# Patient Record
Sex: Female | Born: 1985 | Race: Black or African American | Hispanic: No | Marital: Single | Smoking: Never smoker
Health system: Southern US, Community
[De-identification: ages and names within clinical notes are randomized; demographics above are authoritative.]

## PROBLEM LIST (undated history)

## (undated) ENCOUNTER — Ambulatory Visit (HOSPITAL_COMMUNITY): Admission: EM

## (undated) ENCOUNTER — Inpatient Hospital Stay (HOSPITAL_COMMUNITY): Payer: Self-pay

## (undated) DIAGNOSIS — O139 Gestational [pregnancy-induced] hypertension without significant proteinuria, unspecified trimester: Secondary | ICD-10-CM

## (undated) DIAGNOSIS — D219 Benign neoplasm of connective and other soft tissue, unspecified: Secondary | ICD-10-CM

## (undated) DIAGNOSIS — B999 Unspecified infectious disease: Secondary | ICD-10-CM

## (undated) DIAGNOSIS — L309 Dermatitis, unspecified: Secondary | ICD-10-CM

## (undated) DIAGNOSIS — J45909 Unspecified asthma, uncomplicated: Secondary | ICD-10-CM

## (undated) HISTORY — PX: THERAPEUTIC ABORTION: SHX798

---

## 2006-09-07 ENCOUNTER — Emergency Department (HOSPITAL_COMMUNITY): Admission: EM | Admit: 2006-09-07 | Discharge: 2006-09-07 | Payer: Self-pay | Admitting: Family Medicine

## 2006-09-24 ENCOUNTER — Emergency Department (HOSPITAL_COMMUNITY): Admission: EM | Admit: 2006-09-24 | Discharge: 2006-09-24 | Payer: Self-pay | Admitting: Family Medicine

## 2006-10-05 ENCOUNTER — Emergency Department (HOSPITAL_COMMUNITY): Admission: EM | Admit: 2006-10-05 | Discharge: 2006-10-05 | Payer: Self-pay | Admitting: Emergency Medicine

## 2006-10-22 ENCOUNTER — Emergency Department (HOSPITAL_COMMUNITY): Admission: EM | Admit: 2006-10-22 | Discharge: 2006-10-22 | Payer: Self-pay | Admitting: Emergency Medicine

## 2006-11-11 ENCOUNTER — Emergency Department (HOSPITAL_COMMUNITY): Admission: EM | Admit: 2006-11-11 | Discharge: 2006-11-11 | Payer: Self-pay | Admitting: Emergency Medicine

## 2006-12-14 ENCOUNTER — Emergency Department (HOSPITAL_COMMUNITY): Admission: EM | Admit: 2006-12-14 | Discharge: 2006-12-14 | Payer: Self-pay | Admitting: *Deleted

## 2006-12-16 ENCOUNTER — Inpatient Hospital Stay (HOSPITAL_COMMUNITY): Admission: AD | Admit: 2006-12-16 | Discharge: 2006-12-16 | Payer: Self-pay | Admitting: Family Medicine

## 2006-12-18 ENCOUNTER — Inpatient Hospital Stay (HOSPITAL_COMMUNITY): Admission: AD | Admit: 2006-12-18 | Discharge: 2006-12-18 | Payer: Self-pay | Admitting: Gynecology

## 2006-12-18 ENCOUNTER — Encounter (INDEPENDENT_AMBULATORY_CARE_PROVIDER_SITE_OTHER): Payer: Self-pay | Admitting: Gynecology

## 2007-03-01 ENCOUNTER — Inpatient Hospital Stay (HOSPITAL_COMMUNITY): Admission: AD | Admit: 2007-03-01 | Discharge: 2007-03-01 | Payer: Self-pay | Admitting: Gynecology

## 2007-03-04 ENCOUNTER — Inpatient Hospital Stay (HOSPITAL_COMMUNITY): Admission: AD | Admit: 2007-03-04 | Discharge: 2007-03-04 | Payer: Self-pay | Admitting: Gynecology

## 2007-06-24 ENCOUNTER — Emergency Department (HOSPITAL_COMMUNITY): Admission: EM | Admit: 2007-06-24 | Discharge: 2007-06-24 | Payer: Self-pay | Admitting: Family Medicine

## 2007-10-08 ENCOUNTER — Emergency Department (HOSPITAL_COMMUNITY): Admission: EM | Admit: 2007-10-08 | Discharge: 2007-10-09 | Payer: Self-pay | Admitting: Emergency Medicine

## 2007-12-13 ENCOUNTER — Inpatient Hospital Stay (HOSPITAL_COMMUNITY): Admission: AD | Admit: 2007-12-13 | Discharge: 2007-12-13 | Payer: Self-pay | Admitting: Obstetrics & Gynecology

## 2008-09-12 ENCOUNTER — Emergency Department (HOSPITAL_COMMUNITY): Admission: EM | Admit: 2008-09-12 | Discharge: 2008-09-12 | Payer: Self-pay | Admitting: Emergency Medicine

## 2010-11-12 LAB — URINALYSIS, ROUTINE W REFLEX MICROSCOPIC
Bilirubin Urine: NEGATIVE
Hgb urine dipstick: NEGATIVE
Ketones, ur: NEGATIVE mg/dL
Nitrite: NEGATIVE
Protein, ur: NEGATIVE mg/dL
Specific Gravity, Urine: 1.026 (ref 1.005–1.030)
Urobilinogen, UA: 1 mg/dL (ref 0.0–1.0)

## 2010-11-12 LAB — WET PREP, GENITAL: Trich, Wet Prep: NONE SEEN

## 2010-11-12 LAB — GC/CHLAMYDIA PROBE AMP, GENITAL: GC Probe Amp, Genital: POSITIVE — AB

## 2010-11-12 LAB — HSV 1 ANTIBODY, IGG: HSV 1 Glycoprotein G Ab, IgG: 30.6 IV — ABNORMAL HIGH

## 2010-11-12 LAB — POCT PREGNANCY, URINE: Preg Test, Ur: NEGATIVE

## 2010-11-12 LAB — RPR: RPR Ser Ql: NONREACTIVE

## 2010-11-12 LAB — HSV 2 ANTIBODY, IGG: HSV 2 Glycoprotein G Ab, IgG: 6.5 IV — ABNORMAL HIGH

## 2011-04-23 LAB — URINALYSIS, ROUTINE W REFLEX MICROSCOPIC
Glucose, UA: NEGATIVE
Hgb urine dipstick: NEGATIVE
Protein, ur: NEGATIVE
Specific Gravity, Urine: >1.03 — ABNORMAL HIGH
pH: 5.5

## 2011-04-23 LAB — POCT PREGNANCY, URINE
Operator id: 120561
Preg Test, Ur: POSITIVE

## 2011-05-12 LAB — URINALYSIS, ROUTINE W REFLEX MICROSCOPIC
Bilirubin Urine: NEGATIVE
Glucose, UA: NEGATIVE
Hgb urine dipstick: NEGATIVE
Hgb urine dipstick: NEGATIVE
Ketones, ur: 15 — AB
Ketones, ur: NEGATIVE
Nitrite: NEGATIVE
Nitrite: NEGATIVE
Protein, ur: NEGATIVE
Specific Gravity, Urine: >1.03 — ABNORMAL HIGH
Specific Gravity, Urine: >1.03 — ABNORMAL HIGH
Urobilinogen, UA: 0.2
Urobilinogen, UA: 0.2
pH: 5.5
pH: 6

## 2011-05-12 LAB — CBC
Hemoglobin: 12.9
MCHC: 33.7
MCV: 92.6
RBC: 4.15
RDW: 12.7

## 2011-05-12 LAB — WET PREP, GENITAL
Trich, Wet Prep: NONE SEEN
Yeast Wet Prep HPF POC: NONE SEEN

## 2011-05-12 LAB — POCT PREGNANCY, URINE: Operator id: 114931

## 2011-05-12 LAB — GC/CHLAMYDIA PROBE AMP, GENITAL
Chlamydia, DNA Probe: NEGATIVE
GC Probe Amp, Genital: NEGATIVE

## 2012-12-27 ENCOUNTER — Emergency Department (HOSPITAL_COMMUNITY)
Admission: EM | Admit: 2012-12-27 | Discharge: 2012-12-27 | Disposition: A | Discharge status: Home / Self Care | Payer: Medicaid Other | Source: Home / Self Care | Attending: Emergency Medicine | Admitting: Emergency Medicine

## 2012-12-27 ENCOUNTER — Encounter (HOSPITAL_COMMUNITY): Payer: Self-pay | Admitting: Emergency Medicine

## 2012-12-27 ENCOUNTER — Other Ambulatory Visit (HOSPITAL_COMMUNITY)
Admission: RE | Admit: 2012-12-27 | Discharge: 2012-12-27 | Disposition: A | Discharge status: Home / Self Care | Payer: Medicaid Other | Source: Ambulatory Visit | Attending: Emergency Medicine | Admitting: Emergency Medicine

## 2012-12-27 DIAGNOSIS — N898 Other specified noninflammatory disorders of vagina: Secondary | ICD-10-CM

## 2012-12-27 DIAGNOSIS — Z9189 Other specified personal risk factors, not elsewhere classified: Secondary | ICD-10-CM

## 2012-12-27 DIAGNOSIS — Z113 Encounter for screening for infections with a predominantly sexual mode of transmission: Secondary | ICD-10-CM | POA: Insufficient documentation

## 2012-12-27 DIAGNOSIS — N760 Acute vaginitis: Secondary | ICD-10-CM | POA: Insufficient documentation

## 2012-12-27 DIAGNOSIS — Z202 Contact with and (suspected) exposure to infections with a predominantly sexual mode of transmission: Secondary | ICD-10-CM

## 2012-12-27 LAB — POCT URINALYSIS DIP (DEVICE)
Glucose, UA: NEGATIVE mg/dL
Hgb urine dipstick: NEGATIVE
Specific Gravity, Urine: 1.015 (ref 1.005–1.030)
Urobilinogen, UA: 1 mg/dL (ref 0.0–1.0)

## 2012-12-27 MED ORDER — FLUCONAZOLE 150 MG PO TABS: 150.0000 mg | ORAL_TABLET | Freq: Once | ORAL | Status: DC

## 2012-12-27 MED ORDER — METRONIDAZOLE 500 MG PO TABS: 500.0000 mg | ORAL_TABLET | Freq: Two times a day (BID) | ORAL | Status: AC

## 2012-12-27 NOTE — ED Provider Notes (Signed)
History     CSN: [id number]  Arrival date & time 12/27/12  1045   First MD Initiated Contact with Patient 12/27/12 1128      Chief Complaint  Patient presents with  . Vaginal Discharge    (Consider location/radiation/quality/duration/timing/severity/associated sxs/prior treatment) HPI Comments: Patient presents urgent care describing that for the last 6-7 days she has been noticing a yellow to whitish-looking discharge associated with follow. She is expressing concern and wants to be tested for sexually transmitted illnesses. She denies any abdominal or pelvic pain, urinary symptoms or fevers. Denies any genitalia lesions or rashes. She has not taking any over-the-counter medicines.  Patient is a 27 y.o. female presenting with vaginal discharge. The history is provided by the patient.  Vaginal Discharge Quality:  Mucoid, malodorous, clear and milky Onset quality:  Gradual Duration:  7 days Timing:  Constant Progression:  Worsening Context: spontaneously   Context: not during urination, not genital trauma and not recent antibiotic use   Relieved by:  Nothing Associated symptoms: no abdominal pain, no dyspareunia, no dysuria, no fever, no genital lesions, no nausea, no rash, no urinary incontinence and no vomiting   Risk factors: unprotected sex   Risk factors: no gynecological surgery, no PID and no STI exposure     History reviewed. No pertinent past medical history.  History reviewed. No pertinent past surgical history.  No family history on file.  History  Substance Use Topics  . Smoking status: Never Smoker   . Smokeless tobacco: Not on file  . Alcohol Use: No    OB History   Grav Para Term Preterm Abortions TAB SAB Ect Mult Living                  Review of Systems  Constitutional: Negative for fever, activity change and appetite change.  Gastrointestinal: Negative for nausea, vomiting and abdominal pain.  Genitourinary: Positive for vaginal discharge.  Negative for bladder incontinence, dysuria, urgency, frequency, flank pain, decreased urine volume, enuresis, pelvic pain and dyspareunia.    Allergies  Review of patient's allergies indicates no known allergies.  Home Medications   Current Outpatient Rx  Name  Route  Sig  Dispense  Refill  . fluconazole (DIFLUCAN) 150 MG tablet   Oral   Take 1 tablet (150 mg total) by mouth once.   1 tablet   0   . metroNIDAZOLE (FLAGYL) 500 MG tablet   Oral   Take 1 tablet (500 mg total) by mouth 2 (two) times daily.   14 tablet   0     BP 125/79  Pulse 88  Temp(Src) 99.1 F (37.3 C) (Oral)  Resp 16  SpO2 100%  Physical Exam  Nursing note and vitals reviewed. Constitutional: Vital signs are normal. She appears well-developed and well-nourished.  Abdominal: Soft. Bowel sounds are normal. She exhibits no distension and no mass. There is no tenderness. There is no rebound and no guarding.  Genitourinary: No erythema, tenderness or bleeding around the vagina. No foreign body around the vagina. No signs of injury around the vagina. Vaginal discharge found.   White discharge homogeneous  Musculoskeletal: She exhibits no tenderness.  Neurological: She is alert.  Skin: No rash noted. No erythema.    ED Course  Procedures (including critical care time)  Labs Reviewed  HIV ANTIBODY (ROUTINE TESTING)  RPR  POCT URINALYSIS DIP (DEVICE)  POCT PREGNANCY, URINE  CERVICOVAGINAL ANCILLARY ONLY   No results found.   1. Vaginal discharge   2. Possible  exposure to STD       MDM  Problem #1 vaginal discharge. Pelvic exam was performed were samples were obtained for cytology studies and STD screening. Diagnostic impression was of BV or Candida vaginitis. Patient has been prescribed Flagyl and Diflucan as she opted to be treated empirically prior wet prep results. Have been otherwise if she will be contacted if abnormal test results.  Patient also requested specifically to be tested for HIV  and syphilis we have taken samples today patient has been informed that she will be called if abnormal test results.        Jimmie Molly, MD 12/27/12 [phone]

## 2012-12-27 NOTE — ED Notes (Signed)
Pt c/o yellow vag d/c onset 3 days associated w/a foul odor.  Would also like to be screened for STD Denies: abd/pelvic pain, dysuria, hematuria, f/v/n/d She is alert and oriented w/no signs of acute distress.

## 2013-01-02 NOTE — ED Notes (Signed)
Chart review.

## 2013-01-03 ENCOUNTER — Telehealth (HOSPITAL_COMMUNITY): Payer: Self-pay | Admitting: *Deleted

## 2013-01-03 NOTE — ED Notes (Signed)
Pt. called twice today for her lab results. Pt. told clerk she has called several times in the past few days and no one has called her back. Pt. verified x 2 and given results. ( GC/Chlamydia neg., HIV/RPR non-reactive, Affirm: Gardnerella pos., Candida and Trich neg.).  Pt. told she was adequately treated with Flagyl for bacterial vaginosis.  Pt. told about bacterial vaginosis.  Pt. instructed to get HIV rechecked in 6 mos.  Pt. states the exposure was 04/2012 when a condom broke. Has not been able to contact the person since then. Pt. very anxious. Reassured that if it is over 6 mos. since exposure and it is non-reactive now, then it is not likely that she has contracted it. Pt. feels reassured now. Caitlin Greer 01/03/2013

## 2013-03-10 ENCOUNTER — Emergency Department (INDEPENDENT_AMBULATORY_CARE_PROVIDER_SITE_OTHER)
Admission: EM | Admit: 2013-03-10 | Discharge: 2013-03-10 | Disposition: A | Discharge status: Home / Self Care | Payer: Medicaid Other | Source: Home / Self Care

## 2013-03-10 ENCOUNTER — Encounter (HOSPITAL_COMMUNITY): Payer: Self-pay | Admitting: Emergency Medicine

## 2013-03-10 DIAGNOSIS — L259 Unspecified contact dermatitis, unspecified cause: Secondary | ICD-10-CM

## 2013-03-10 DIAGNOSIS — L309 Dermatitis, unspecified: Secondary | ICD-10-CM

## 2013-03-10 MED ORDER — TRIAMCINOLONE ACETONIDE 0.1 % EX CREA: TOPICAL_CREAM | Freq: Two times a day (BID) | CUTANEOUS | Status: DC

## 2013-03-10 NOTE — ED Notes (Signed)
Pt c/o rash on on legs, abd, and arms onset 3 days Sxs include: itchiness, burning sensation Denies new: hygiene products, foods, meds She's alert w/no signs of acute distress.

## 2013-03-10 NOTE — ED Provider Notes (Signed)
Caitlin Greer is a 27 y.o. female who presents to Urgent Care today for rash. Patient has developed an itchy rash on both legs in small patches for the last 3 days. It does not seem to be worsening. She has not tried any medications. She denies any new medications new soaps shampoos etc. She feels well denies any flulike symptoms. She denies any rash in her mouth vagina or anus or eyes.  She feels well otherwise.    PMH reviewed. Healthy otherwise History  Substance Use Topics  . Smoking status: Never Smoker   . Smokeless tobacco: Not on file  . Alcohol Use: No   ROS as above Medications reviewed. No current facility-administered medications for this encounter.   Current Outpatient Prescriptions  Medication Sig Dispense Refill  . triamcinolone cream (KENALOG) 0.1 % Apply topically 2 (two) times daily.  60 g  1    Exam:  BP 107/77  Pulse 76  Temp(Src) 98.3 F (36.8 C) (Oral)  Resp 16  SpO2 100%  LMP 03/10/2013 Gen: Well NAD HEENT: EOMI,  MMM Lungs: CTABL Nl WOB Heart: RRR no MRG Abd: NABS, NT, ND Exts: Non edematous BL  LE, warm and well perfused.  Skin: 3 cm diameter patches of small erythematous papules on both legs. Nontender. No scale or discharge.   No results found for this or any previous visit (from the past 24 hour(s)). No results found.  Assessment and Plan: 27 y.o. female with dermatitis. Likely contact dermatitis. Plan to treat with triamcinolone. Followup as needed.  Discussed warning signs or symptoms. Please see discharge instructions. Patient expresses understanding.      Rodolph Bong, MD 03/10/13 [phone]

## 2013-07-28 DIAGNOSIS — B999 Unspecified infectious disease: Secondary | ICD-10-CM

## 2013-07-28 HISTORY — DX: Unspecified infectious disease: B99.9

## 2013-07-28 NOTE — L&D Delivery Note (Signed)
Delivery Note At 11:14 PM a viable female was delivered via VBAC, Spontaneous (Presentation: ;  ).  APGAR: , ; weight  .   Placenta status: , .  Cord: 3 vessels with the following complications: None.  Cord pH: not done  Anesthesia: Epidural  Episiotomy: None Lacerations: 1st degree;Perineal Suture Repair: 2.0 vicryl Est. Blood Loss (mL):    Mom to postpartum.  Baby to Couplet care / Skin to Skin.  Renatta Shrieves A 06/07/2014, 11:35 PM

## 2013-10-09 ENCOUNTER — Emergency Department (HOSPITAL_COMMUNITY)
Admission: EM | Admit: 2013-10-09 | Discharge: 2013-10-09 | Discharge status: Against Medical Advice | Payer: Medicaid Other | Attending: Emergency Medicine | Admitting: Emergency Medicine

## 2013-10-09 ENCOUNTER — Emergency Department (HOSPITAL_COMMUNITY): Payer: Medicaid Other

## 2013-10-09 ENCOUNTER — Encounter (HOSPITAL_COMMUNITY): Payer: Self-pay | Admitting: Emergency Medicine

## 2013-10-09 DIAGNOSIS — Z3490 Encounter for supervision of normal pregnancy, unspecified, unspecified trimester: Secondary | ICD-10-CM

## 2013-10-09 DIAGNOSIS — O99891 Other specified diseases and conditions complicating pregnancy: Secondary | ICD-10-CM | POA: Insufficient documentation

## 2013-10-09 DIAGNOSIS — IMO0002 Reserved for concepts with insufficient information to code with codable children: Secondary | ICD-10-CM | POA: Insufficient documentation

## 2013-10-09 DIAGNOSIS — W1809XA Striking against other object with subsequent fall, initial encounter: Secondary | ICD-10-CM | POA: Insufficient documentation

## 2013-10-09 DIAGNOSIS — Y929 Unspecified place or not applicable: Secondary | ICD-10-CM | POA: Insufficient documentation

## 2013-10-09 DIAGNOSIS — S6990XA Unspecified injury of unspecified wrist, hand and finger(s), initial encounter: Secondary | ICD-10-CM | POA: Insufficient documentation

## 2013-10-09 DIAGNOSIS — S6980XA Other specified injuries of unspecified wrist, hand and finger(s), initial encounter: Secondary | ICD-10-CM | POA: Insufficient documentation

## 2013-10-09 DIAGNOSIS — O9989 Other specified diseases and conditions complicating pregnancy, childbirth and the puerperium: Principal | ICD-10-CM

## 2013-10-09 DIAGNOSIS — Y939 Activity, unspecified: Secondary | ICD-10-CM | POA: Insufficient documentation

## 2013-10-09 LAB — POC URINE PREG, ED: Preg Test, Ur: POSITIVE — AB

## 2013-10-09 MED ORDER — TETANUS-DIPHTH-ACELL PERTUSSIS 5-2.5-18.5 LF-MCG/0.5 IM SUSP: 0.5000 mL | Freq: Once | INTRAMUSCULAR | Status: DC

## 2013-10-09 NOTE — ED Notes (Signed)
Per transport pt left from xray stated she did not want to wait any longer.

## 2013-10-09 NOTE — ED Provider Notes (Signed)
CSN: [id number]     Arrival date & time 10/09/13  1912 History  This chart was scribed for non-physician practitioner, Domenic Moras, PA-C working with Mercie Eon. Karle Starch, MD by Frederich Balding, ED scribe. This patient was seen in room TR08C/TR08C and the patient's care was started at 8:16 PM.   Chief Complaint  Patient presents with  . Finger Injury   The history is provided by the patient. No language interpreter was used.   HPI Comments: Caitlin Greer is a 28 y.o. female who presents to the Emergency Department complaining of right thumb injury that occurred earlier today. Pt wash pushed, fell and hit her right thumb. She has sudden onset right thumb pain with associated mild swelling. Pt also has an abrasion to her thumb. Movement worsens the pain. Denies other injuries. She is unsure of when her last tetanus was. Pt states there is a possibility that she is pregnant. Denies abdominal pain, vaginal bleeding, vaginal discharge. LMP was 2 months ago. She has been pregnant twice but had one miscarriage.   History reviewed. No pertinent past medical history. History reviewed. No pertinent past surgical history. No family history on file. History  Substance Use Topics  . Smoking status: Never Smoker   . Smokeless tobacco: Not on file  . Alcohol Use: No   OB History   Grav Para Term Preterm Abortions TAB SAB Ect Mult Living                 Review of Systems  Gastrointestinal: Negative for abdominal pain.  Genitourinary: Negative for vaginal bleeding and vaginal discharge.  Musculoskeletal: Positive for arthralgias and joint swelling.  Skin: Positive for wound.  All other systems reviewed and are negative.   Allergies  Review of patient's allergies indicates no known allergies.  Home Medications   Current Outpatient Rx  Name  Route  Sig  Dispense  Refill  . triamcinolone cream (KENALOG) 0.1 %   Topical   Apply topically 2 (two) times daily.   60 g   1    BP 118/74  Pulse 80   Temp(Src) 97.7 F (36.5 C) (Oral)  Resp 16  SpO2 100%  Physical Exam  Nursing note and vitals reviewed. Constitutional: She is oriented to person, place, and time. She appears well-developed and well-nourished. No distress.  HENT:  Head: Normocephalic and atraumatic.  Eyes: EOM are normal.  Neck: Neck supple. No tracheal deviation present.  Cardiovascular: Normal rate.   Pulmonary/Chest: Effort normal. No respiratory distress.  Musculoskeletal: Normal range of motion.  Small skin abrasion noted to the lateral aspects of right thumb. Not actively bleeding. Thumb diffusely tender throughout. Small amount of swelling noted to MCP. Decreased ROM due to pain.   Neurological: She is alert and oriented to person, place, and time.  Skin: Skin is warm and dry.  Psychiatric: She has a normal mood and affect. Her behavior is normal.    ED Course  Procedures (including critical care time)  DIAGNOSTIC STUDIES: Oxygen Saturation is 100% on RA, normal by my interpretation.    COORDINATION OF CARE: 8:18 PM-Discussed treatment plan which includes xray and pregnancy test with pt at bedside and pt agreed to plan.   9:38 PM Pregnancy test is positive.  However pt left prior to discussing result and obtaining xray of her finger.  She has a small superficial laceration overlying the medial aspect of her R index finger, not requiring sutures.  Pt left AMA.  Pt is not aware that  she is pregnant  Labs Review Labs Reviewed  POC URINE PREG, ED - Abnormal; Notable for the following:    Preg Test, Ur POSITIVE (*)    All other components within normal limits   Imaging Review No results found.   EKG Interpretation None      MDM   Final diagnoses:  Finger injury  Pregnancy   BP 118/74  Pulse 80  Temp(Src) 97.7 F (36.5 C) (Oral)  Resp 16  SpO2 100%  LMP 03/10/2013  I personally performed the services described in this documentation, which was scribed in my presence. The recorded  information has been reviewed and is accurate.    Domenic Moras, PA-C 10/09/13 2139

## 2013-10-09 NOTE — ED Notes (Signed)
Cleaned with shur-clean. Bleeding controlled. 1 cm laceration noted.

## 2013-10-09 NOTE — ED Notes (Signed)
Pt. tripped and fell today injured her right thumb with slight swelling/abrsion . No LOC / ambulatory / respirations unlabored.

## 2013-10-09 NOTE — ED Provider Notes (Signed)
Medical screening examination/treatment/procedure(s) were performed by non-physician practitioner and as supervising physician I was immediately available for consultation/collaboration.   EKG Interpretation None        Charles B. Sheldon, MD 10/09/13 2350 

## 2013-10-09 NOTE — ED Notes (Signed)
Patient transported to X-ray 

## 2013-10-09 NOTE — ED Notes (Signed)
Message left for pt to call related to positive pregnancy

## 2013-10-10 ENCOUNTER — Telehealth (HOSPITAL_BASED_OUTPATIENT_CLINIC_OR_DEPARTMENT_OTHER): Payer: Self-pay

## 2013-11-10 ENCOUNTER — Telehealth: Payer: Self-pay | Admitting: Advanced Practice Midwife

## 2013-11-10 ENCOUNTER — Encounter (HOSPITAL_COMMUNITY): Payer: Self-pay | Admitting: *Deleted

## 2013-11-10 ENCOUNTER — Inpatient Hospital Stay (HOSPITAL_COMMUNITY): Payer: Medicaid Other

## 2013-11-10 ENCOUNTER — Inpatient Hospital Stay (HOSPITAL_COMMUNITY)
Admission: AD | Admit: 2013-11-10 | Discharge: 2013-11-10 | Disposition: A | Discharge status: Home / Self Care | Payer: Medicaid Other | Source: Ambulatory Visit | Attending: Obstetrics & Gynecology | Admitting: Obstetrics & Gynecology

## 2013-11-10 DIAGNOSIS — A499 Bacterial infection, unspecified: Secondary | ICD-10-CM | POA: Insufficient documentation

## 2013-11-10 DIAGNOSIS — O210 Mild hyperemesis gravidarum: Secondary | ICD-10-CM | POA: Insufficient documentation

## 2013-11-10 DIAGNOSIS — B9689 Other specified bacterial agents as the cause of diseases classified elsewhere: Secondary | ICD-10-CM

## 2013-11-10 DIAGNOSIS — O219 Vomiting of pregnancy, unspecified: Secondary | ICD-10-CM

## 2013-11-10 DIAGNOSIS — N760 Acute vaginitis: Secondary | ICD-10-CM

## 2013-11-10 DIAGNOSIS — O209 Hemorrhage in early pregnancy, unspecified: Secondary | ICD-10-CM

## 2013-11-10 DIAGNOSIS — O2390 Unspecified genitourinary tract infection in pregnancy, unspecified trimester: Secondary | ICD-10-CM | POA: Insufficient documentation

## 2013-11-10 HISTORY — DX: Unspecified asthma, uncomplicated: J45.909

## 2013-11-10 LAB — URINALYSIS, ROUTINE W REFLEX MICROSCOPIC
Bilirubin Urine: NEGATIVE
GLUCOSE, UA: NEGATIVE mg/dL
HGB URINE DIPSTICK: NEGATIVE
KETONES UR: NEGATIVE mg/dL
Leukocytes, UA: NEGATIVE
Nitrite: POSITIVE — AB
PROTEIN: NEGATIVE mg/dL
Specific Gravity, Urine: >1.03 — ABNORMAL HIGH (ref 1.005–1.030)
UROBILINOGEN UA: 1 mg/dL (ref 0.0–1.0)
pH: 6 (ref 5.0–8.0)

## 2013-11-10 LAB — URINE MICROSCOPIC-ADD ON

## 2013-11-10 LAB — WET PREP, GENITAL
TRICH WET PREP: NONE SEEN
YEAST WET PREP: NONE SEEN

## 2013-11-10 LAB — POCT PREGNANCY, URINE: Preg Test, Ur: POSITIVE — AB

## 2013-11-10 MED ORDER — NITROFURANTOIN MONOHYD MACRO 100 MG PO CAPS: 100.0000 mg | ORAL_CAPSULE | Freq: Two times a day (BID) | ORAL | Status: DC

## 2013-11-10 MED ORDER — METRONIDAZOLE 0.75 % VA GEL: 1.0000 | Freq: Every day | VAGINAL | Status: DC

## 2013-11-10 MED ORDER — METRONIDAZOLE 500 MG PO TABS: 500.0000 mg | ORAL_TABLET | Freq: Two times a day (BID) | ORAL | Status: DC

## 2013-11-10 MED ORDER — PROMETHAZINE HCL 25 MG PO TABS: 12.5000 mg | ORAL_TABLET | Freq: Four times a day (QID) | ORAL | Status: DC | PRN

## 2013-11-10 NOTE — Discharge Instructions (Signed)
Nausea medication to take during pregnancy:   Unisom (doxylamine succinate 25 mg tablets) Take one tablet daily at bedtime. If symptoms are not adequately controlled, the dose can be increased to a maximum recommended dose of two tablets daily (1/2 tablet in the morning, 1/2 tablet mid-afternoon and one at bedtime).  Vitamin B6 100mg  tablets. Take one tablet twice a day (up to 200 mg per day).  Add Phenergan as prescribed to take as needed.   Nausea and Vomiting Nausea is a sick feeling that often comes before throwing up (vomiting). Vomiting is a reflex where stomach contents come out of your mouth. Vomiting can cause severe loss of body fluids (dehydration). Children and elderly adults can become dehydrated quickly, especially if they also have diarrhea. Nausea and vomiting are symptoms of a condition or disease. It is important to find the cause of your symptoms. CAUSES   Direct irritation of the stomach lining. This irritation can result from increased acid production (gastroesophageal reflux disease), infection, food poisoning, taking certain medicines (such as nonsteroidal anti-inflammatory drugs), alcohol use, or tobacco use.  Signals from the brain.These signals could be caused by a headache, heat exposure, an inner ear disturbance, increased pressure in the brain from injury, infection, a tumor, or a concussion, pain, emotional stimulus, or metabolic problems.  An obstruction in the gastrointestinal tract (bowel obstruction).  Illnesses such as diabetes, hepatitis, gallbladder problems, appendicitis, kidney problems, cancer, sepsis, atypical symptoms of a heart attack, or eating disorders.  Medical treatments such as chemotherapy and radiation.  Receiving medicine that makes you sleep (general anesthetic) during surgery. DIAGNOSIS Your caregiver may ask for tests to be done if the problems do not improve after a few days. Tests may also be done if symptoms are severe or if the  reason for the nausea and vomiting is not clear. Tests may include:  Urine tests.  Blood tests.  Stool tests.  Cultures (to look for evidence of infection).  X-rays or other imaging studies. Test results can help your caregiver make decisions about treatment or the need for additional tests. TREATMENT You need to stay well hydrated. Drink frequently but in small amounts.You may wish to drink water, sports drinks, clear broth, or eat frozen ice pops or gelatin dessert to help stay hydrated.When you eat, eating slowly may help prevent nausea.There are also some antinausea medicines that may help prevent nausea. HOME CARE INSTRUCTIONS   Take all medicine as directed by your caregiver.  If you do not have an appetite, do not force yourself to eat. However, you must continue to drink fluids.  If you have an appetite, eat a normal diet unless your caregiver tells you differently.  Eat a variety of complex carbohydrates (rice, wheat, potatoes, bread), lean meats, yogurt, fruits, and vegetables.  Avoid high-fat foods because they are more difficult to digest.  Drink enough water and fluids to keep your urine clear or pale yellow.  If you are dehydrated, ask your caregiver for specific rehydration instructions. Signs of dehydration may include:  Severe thirst.  Dry lips and mouth.  Dizziness.  Dark urine.  Decreasing urine frequency and amount.  Confusion.  Rapid breathing or pulse. SEEK IMMEDIATE MEDICAL CARE IF:   You have blood or brown flecks (like coffee grounds) in your vomit.  You have black or bloody stools.  You have a severe headache or stiff neck.  You are confused.  You have severe abdominal pain.  You have chest pain or trouble breathing.  You do  not urinate at least once every 8 hours.  You develop cold or clammy skin.  You continue to vomit for longer than 24 to 48 hours.  You have a fever. MAKE SURE YOU:   Understand these  instructions.  Will watch your condition.  Will get help right away if you are not doing well or get worse. Document Released: 07/14/2005 Document Revised: 10/06/2011 Document Reviewed: 12/11/2010 [facility] Patient Information 2014 [place], [place].  Bacterial Vaginosis Bacterial vaginosis is a vaginal infection that occurs when the normal balance of bacteria in the vagina is disrupted. It results from an overgrowth of certain bacteria. This is the most common vaginal infection in women of childbearing age. Treatment is important to prevent complications, especially in pregnant women, as it can cause a premature delivery. CAUSES  Bacterial vaginosis is caused by an increase in harmful bacteria that are normally present in smaller amounts in the vagina. Several different kinds of bacteria can cause bacterial vaginosis. However, the reason that the condition develops is not fully understood. RISK FACTORS Certain activities or behaviors can put you at an increased risk of developing bacterial vaginosis, including:  Having a new sex partner or multiple sex partners.  Douching.  Using an intrauterine device (IUD) for contraception. Women do not get bacterial vaginosis from toilet seats, bedding, swimming pools, or contact with objects around them. SIGNS AND SYMPTOMS  Some women with bacterial vaginosis have no signs or symptoms. Common symptoms include:  Grey vaginal discharge.  A fishlike odor with discharge, especially after sexual intercourse.  Itching or burning of the vagina and vulva.  Burning or pain with urination. DIAGNOSIS  Your health care provider will take a medical history and examine the vagina for signs of bacterial vaginosis. A sample of vaginal fluid may be taken. Your health care provider will look at this sample under a microscope to check for bacteria and abnormal cells. A vaginal pH test may also be done.  TREATMENT  Bacterial vaginosis may be treated with antibiotic  medicines. These may be given in the form of a pill or a vaginal cream. A second round of antibiotics may be prescribed if the condition comes back after treatment.  HOME CARE INSTRUCTIONS   Only take over-the-counter or prescription medicines as directed by your health care provider.  If antibiotic medicine was prescribed, take it as directed. Make sure you finish it even if you start to feel better.  Do not have sex until treatment is completed.  Tell all sexual partners that you have a vaginal infection. They should see their health care provider and be treated if they have problems, such as a mild rash or itching.  Practice safe sex by using condoms and only having one sex partner. SEEK MEDICAL CARE IF:   Your symptoms are not improving after 3 days of treatment.  You have increased discharge or pain.  You have a fever. MAKE SURE YOU:   Understand these instructions.  Will watch your condition.  Will get help right away if you are not doing well or get worse. FOR MORE INFORMATION  Centers for Disease Control and Prevention, Division of STD Prevention: [employer] American Sexual Health Association (ASHA): www.ashastd.org  Document Released: 07/14/2005 Document Revised: 05/04/2013 Document Reviewed: 02/23/2013 [facility] Patient Information 2014 [place]. Vaginal Bleeding During Pregnancy, First Trimester A small amount of bleeding (spotting) from the vagina is relatively common in early pregnancy. It usually stops on its own. Various things may cause bleeding or spotting in early pregnancy.  Some bleeding may be related to the pregnancy, and some may not. In most cases, the bleeding is normal and is not a problem. However, bleeding can also be a sign of something serious. Be sure to tell your health care provider about any vaginal bleeding right away. Some possible causes of vaginal bleeding during the first trimester include:  Infection or inflammation of the  cervix.  Growths (polyps) on the cervix.  Miscarriage or threatened miscarriage.  Pregnancy tissue has developed outside of the uterus and in a fallopian tube (tubal pregnancy).  Tiny cysts have developed in the uterus instead of pregnancy tissue (molar pregnancy). HOME CARE INSTRUCTIONS  Watch your condition for any changes. The following actions may help to lessen any discomfort you are feeling:  Follow your health care provider's instructions for limiting your activity. If your health care provider orders bed rest, you may need to stay in bed and only get up to use the bathroom. However, your health care provider may allow you to continue light activity.  If needed, make plans for someone to help with your regular activities and responsibilities while you are on bed rest.  Keep track of the number of pads you use each day, how often you change pads, and how soaked (saturated) they are. Write this down.  Do not use tampons. Do not douche.  Do not have sexual intercourse or orgasms until approved by your health care provider.  If you pass any tissue from your vagina, save the tissue so you can show it to your health care provider.  Only take over-the-counter or prescription medicines as directed by your health care provider.  Do not take aspirin because it can make you bleed.  Keep all follow-up appointments as directed by your health care provider. SEEK MEDICAL CARE IF:  You have any vaginal bleeding during any part of your pregnancy.  You have cramps or labor pains. SEEK IMMEDIATE MEDICAL CARE IF:   You have severe cramps in your back or belly (abdomen).  You have a fever, not controlled by medicine.  You pass large clots or tissue from your vagina.  Your bleeding increases.  You feel lightheaded or weak, or you have fainting episodes.  You have chills.  You are leaking fluid or have a gush of fluid from your vagina.  You pass out while having a bowel  movement. MAKE SURE YOU:  Understand these instructions.  Will watch your condition.  Will get help right away if you are not doing well or get worse. Document Released: 04/23/2005 Document Revised: 05/04/2013 Document Reviewed: 03/21/2013 [facility] Patient Information 2014 [place].

## 2013-11-10 NOTE — Telephone Encounter (Signed)
Patient called back. I informed her of UTI and that antibiotic was called in. She will pick up.

## 2013-11-10 NOTE — MAU Note (Signed)
Started spotting last night. Today is orange red in color, with a foul odor.  No real pain, just notes a tightness in the abdomen area.

## 2013-11-10 NOTE — MAU Provider Note (Signed)
Chief Complaint: Vaginal Bleeding   First Provider Initiated Contact with Patient 11/10/13 1246     SUBJECTIVE HPI: Caitlin Greer is a 28 y.o. G2P1001 at [redacted]w[redacted]d by LMP who presents to maternity admissions reporting vaginal bleeding with onset yesterday.  She reports dark brown spotting when wiping yesterday and orange discharge with vaginal odor today.  She has had positive HPT.  She denies abdominal pain, LOF, vaginal itching/burning, urinary symptoms, h/a, dizziness, n/v, or fever/chills.    Past Medical History  Diagnosis Date  . Asthma     managed with rescue inhaler only    Past Surgical History  Procedure Laterality Date  . Cesarean section     History   Social History  . Marital Status: Single    Spouse Name: N/A    Number of Children: N/A  . Years of Education: N/A   Occupational History  . Not on file.   Social History Main Topics  . Smoking status: Never Smoker   . Smokeless tobacco: Not on file  . Alcohol Use: No  . Drug Use: No  . Sexual Activity: Not on file   Other Topics Concern  . Not on file   Social History Narrative  . No narrative on file   No current facility-administered medications on file prior to encounter.   Current Outpatient Prescriptions on File Prior to Encounter  Medication Sig Dispense Refill  . albuterol (PROVENTIL HFA;VENTOLIN HFA) 108 (90 BASE) MCG/ACT inhaler Inhale 2 puffs into the lungs every 6 (six) hours as needed for wheezing or shortness of breath.      Marland Kitchen albuterol (PROVENTIL) (2.5 MG/3ML) 0.083% nebulizer solution Take 2.5 mg by nebulization every 6 (six) hours as needed for wheezing or shortness of breath.       No Known Allergies  ROS: Pertinent items in HPI  OBJECTIVE Blood pressure 117/79, pulse 92, temperature 98.6 F (37 C), temperature source Oral, resp. rate 18, last menstrual period 08/30/2013, unknown if currently breastfeeding. GENERAL: Well-developed, well-nourished female in no acute distress.  HEENT:  Normocephalic HEART: normal rate RESP: normal effort ABDOMEN: Soft, non-tender, vertical C/S scar on abdomen EXTREMITIES: Nontender, no edema NEURO: Alert and oriented Pelvic exam: Cervix pink, visually closed, without lesion, small amount pink frothy discharge, vaginal walls and external genitalia normal Bimanual exam: Cervix 0/long/high, firm, anterior, neg CMT, uterus nontender, ~10 week size, adnexa without tenderness, enlargement, or mass  FHT 164 by doppler  LAB RESULTS Results for orders placed during the hospital encounter of 11/10/13 (from the past 24 hour(s))  URINALYSIS, ROUTINE W REFLEX MICROSCOPIC     Status: Abnormal   Collection Time    11/10/13 12:23 PM      Result Value Ref Range   Color, Urine YELLOW  YELLOW   APPearance CLEAR  CLEAR   Specific Gravity, Urine >1.030 (*) 1.005 - 1.030   pH 6.0  5.0 - 8.0   Glucose, UA NEGATIVE  NEGATIVE mg/dL   Hgb urine dipstick NEGATIVE  NEGATIVE   Bilirubin Urine NEGATIVE  NEGATIVE   Ketones, ur NEGATIVE  NEGATIVE mg/dL   Protein, ur NEGATIVE  NEGATIVE mg/dL   Urobilinogen, UA 1.0  0.0 - 1.0 mg/dL   Nitrite POSITIVE (*) NEGATIVE   Leukocytes, UA NEGATIVE  NEGATIVE  URINE MICROSCOPIC-ADD ON     Status: Abnormal   Collection Time    11/10/13 12:23 PM      Result Value Ref Range   Squamous Epithelial / LPF RARE  RARE  WBC, UA 0-2  <3 WBC/hpf   Bacteria, UA MANY (*) RARE   Urine-Other MUCOUS PRESENT    POCT PREGNANCY, URINE     Status: Abnormal   Collection Time    11/10/13 12:24 PM      Result Value Ref Range   Preg Test, Ur POSITIVE (*) NEGATIVE  WET PREP, GENITAL     Status: Abnormal   Collection Time    11/10/13  1:17 PM      Result Value Ref Range   Yeast Wet Prep HPF POC NONE SEEN  NONE SEEN   Trich, Wet Prep NONE SEEN  NONE SEEN   Clue Cells Wet Prep HPF POC MODERATE (*) NONE SEEN   WBC, Wet Prep HPF POC MODERATE (*) NONE SEEN    IMAGING US Ob Comp Less 14 Wks  11/10/2013   CLINICAL DATA:  Pregnant,  vaginal bleeding  EXAM: OBSTETRIC <14 WK ULTRASOUND  TECHNIQUE: Transabdominal ultrasound was performed for evaluation of the gestation as well as the maternal uterus and adnexal regions.  COMPARISON:  None.  FINDINGS: Intrauterine gestational sac: Visualized/normal in shape.  Yolk sac:  Present  Embryo:  Present  Cardiac Activity: Present  Heart Rate: 152 bpm  CRL:   38.6  mm   10 w 6 d                  [place] EDC: 06/02/2014  Maternal uterus/adnexae: No subchorionic hemorrhage.  Right ovary measures 3.7 x 4.3 x 4.2 cm and is notable for a 2.7 cm corpus luteal cyst.  Left ovary measures 2.7 x 1.4 x 1.7 cm.  No free fluid.  IMPRESSION: Single live intrauterine gestation with estimated gestational age [redacted] weeks 6 days by crown-rump length.   Electronically Signed   By: Julian Hy M.D.   On: 11/10/2013 14:07    ASSESSMENT 1. Bacterial vaginosis   2. Vaginal bleeding in pregnant patient at less than [redacted] weeks gestation   3. Nausea and vomiting in pregnancy prior to [redacted] weeks gestation     PLAN Discharge home Metrogel Q HS x5 nights Unisom and Vitamin B6--instructions given on how/when to take Phenergan 12.5-25 mg PO Q 6 hours PRN List of prenatal providers and pregnancy verification letter given Return to MAU as needed for emergencies  Addendum:  Called pt to inform her of positive results for UTI.  Pt phone not accepting messages.  See Epic notes.  Macrobid 100 mg BID sent to pt pharmacy.  Urine sent for culture.     Medication List         albuterol 108 (90 BASE) MCG/ACT inhaler  Commonly known as:  PROVENTIL HFA;VENTOLIN HFA  Inhale 2 puffs into the lungs every 6 (six) hours as needed for wheezing or shortness of breath.     albuterol (2.5 MG/3ML) 0.083% nebulizer solution  Commonly known as:  PROVENTIL  Take 2.5 mg by nebulization every 6 (six) hours as needed for wheezing or shortness of breath.     metroNIDAZOLE 0.75 % vaginal gel  Commonly known as:  METROGEL  Place 1  Applicatorful vaginally at bedtime. Apply one applicatorful to vagina at bedtime for 5 days     multivitamin with minerals Tabs tablet  Take 1 tablet by mouth daily.     promethazine 25 MG tablet  Commonly known as:  PHENERGAN  Take 0.5-1 tablets (12.5-25 mg total) by mouth every 6 (six) hours as needed for nausea or vomiting.  Follow-up Information   Call [facility]. (As needed)    Specialty:  Obstetrics and Gynecology   Contact information:   [place] [address] [place] Certified Nurse-Midwife 11/10/2013  6:16 PM

## 2013-11-10 NOTE — MAU Note (Signed)
28 yo, LMP 08/30/13 with +HPT, presents to MAU with c/o spotting yesterday 1830 and "orange-red" spotting while wiping today. No pain otherwise.

## 2013-11-10 NOTE — Telephone Encounter (Signed)
Pt phone unable to accept messages.  Attempted to call pt to let her know about positive results for UTI during MAU visit today.  Macrobid 100 mg BID x 7 days sent to pharmacy.

## 2013-11-11 ENCOUNTER — Telehealth: Payer: Self-pay | Admitting: Advanced Practice Midwife

## 2013-11-11 ENCOUNTER — Other Ambulatory Visit: Payer: Self-pay | Admitting: Advanced Practice Midwife

## 2013-11-11 LAB — GC/CHLAMYDIA PROBE AMP
CT Probe RNA: NEGATIVE
GC Probe RNA: NEGATIVE

## 2013-11-11 MED ORDER — GLYCOPYRROLATE 2 MG PO TABS: 1.0000 mg | ORAL_TABLET | Freq: Three times a day (TID) | ORAL | Status: DC | PRN

## 2013-11-11 NOTE — Telephone Encounter (Signed)
Pt called to report Robinul not at pharmacy after MAU visit on 11/10/13.  Robinul sent to pharmacy. Notified pt.

## 2013-11-13 LAB — URINE CULTURE: Colony Count: 85000

## 2013-11-14 NOTE — MAU Provider Note (Signed)
Attestation of Attending Supervision of Advanced Practitioner (PA/CNM/NP): Evaluation and management procedures were performed by the Advanced Practitioner under my supervision and collaboration.  I have reviewed the Advanced Practitioner's note and chart, and I agree with the management and plan.  Teana Lindahl, MD, FACOG Attending Obstetrician & Gynecologist Faculty Practice, [facility]  

## 2013-12-20 LAB — OB RESULTS CONSOLE RUBELLA ANTIBODY, IGM: RUBELLA: IMMUNE

## 2013-12-20 LAB — OB RESULTS CONSOLE ABO/RH: RH TYPE: POSITIVE

## 2013-12-20 LAB — OB RESULTS CONSOLE ANTIBODY SCREEN: Antibody Screen: NEGATIVE

## 2013-12-20 LAB — OB RESULTS CONSOLE HEPATITIS B SURFACE ANTIGEN: HEP B S AG: NEGATIVE

## 2013-12-20 LAB — OB RESULTS CONSOLE RPR: RPR: NONREACTIVE

## 2013-12-20 LAB — OB RESULTS CONSOLE HIV ANTIBODY (ROUTINE TESTING): HIV: NONREACTIVE

## 2014-02-22 ENCOUNTER — Encounter (HOSPITAL_COMMUNITY): Payer: Self-pay | Admitting: Emergency Medicine

## 2014-02-22 ENCOUNTER — Emergency Department (HOSPITAL_COMMUNITY)
Admission: EM | Admit: 2014-02-22 | Discharge: 2014-02-22 | Disposition: A | Discharge status: Home / Self Care | Payer: Medicaid Other | Source: Home / Self Care | Attending: Family Medicine | Admitting: Family Medicine

## 2014-02-22 ENCOUNTER — Other Ambulatory Visit (HOSPITAL_COMMUNITY)
Admission: RE | Admit: 2014-02-22 | Discharge: 2014-02-22 | Disposition: A | Discharge status: Home / Self Care | Payer: Medicaid Other | Source: Ambulatory Visit | Attending: Family Medicine | Admitting: Family Medicine

## 2014-02-22 DIAGNOSIS — B3731 Acute candidiasis of vulva and vagina: Secondary | ICD-10-CM

## 2014-02-22 DIAGNOSIS — Z113 Encounter for screening for infections with a predominantly sexual mode of transmission: Secondary | ICD-10-CM | POA: Diagnosis not present

## 2014-02-22 DIAGNOSIS — N760 Acute vaginitis: Secondary | ICD-10-CM | POA: Insufficient documentation

## 2014-02-22 DIAGNOSIS — B373 Candidiasis of vulva and vagina: Secondary | ICD-10-CM

## 2014-02-22 LAB — POCT URINALYSIS DIP (DEVICE)
BILIRUBIN URINE: NEGATIVE
GLUCOSE, UA: NEGATIVE mg/dL
Hgb urine dipstick: NEGATIVE
Ketones, ur: NEGATIVE mg/dL
NITRITE: NEGATIVE
Protein, ur: NEGATIVE mg/dL
SPECIFIC GRAVITY, URINE: 1.015 (ref 1.005–1.030)
UROBILINOGEN UA: 0.2 mg/dL (ref 0.0–1.0)
pH: 6 (ref 5.0–8.0)

## 2014-02-22 NOTE — ED Provider Notes (Signed)
Medical screening examination/treatment/procedure(s) were performed by resident physician or non-physician practitioner and as supervising physician I was immediately available for consultation/collaboration.   Pauline Good MD.   Billy Fischer, MD 02/22/14 2017

## 2014-02-22 NOTE — ED Notes (Signed)
C/o 3 rd day duration of vaginal irritation; LMP 08-30-2013

## 2014-02-22 NOTE — ED Provider Notes (Signed)
CSN: [id number]     Arrival date & time 02/22/14  1804 History   First MD Initiated Contact with Patient 02/22/14 1822     Chief Complaint  Patient presents with  . Vaginal Itching   (Consider location/radiation/quality/duration/timing/severity/associated sxs/prior Treatment) Patient is a 28 y.o. female presenting with vaginal itching. The history is provided by the patient. No language interpreter was used.  Vaginal Itching This is a new problem. The current episode started more than 2 days ago. The problem occurs constantly. The problem has been gradually worsening. Pertinent negatives include no abdominal pain. Nothing aggravates the symptoms. Nothing relieves the symptoms. She has tried nothing for the symptoms.   Pt reports vaginal discharge.  Pt saw Dr. Ruthann Cancer and was given diflucan.   Pt here tonight with concern of std Past Medical History  Diagnosis Date  . Asthma     managed with rescue inhaler only    Past Surgical History  Procedure Laterality Date  . Cesarean section     History reviewed. No pertinent family history. History  Substance Use Topics  . Smoking status: Never Smoker   . Smokeless tobacco: Not on file  . Alcohol Use: No   OB History   Grav Para Term Preterm Abortions TAB SAB Ect Mult Living   2 1 1       1      Review of Systems  Gastrointestinal: Negative for abdominal pain.  Genitourinary: Positive for vaginal discharge.  All other systems reviewed and are negative.   Allergies  Review of patient's allergies indicates no known allergies.  Home Medications   Prior to Admission medications   Medication Sig Start Date End Date Taking? Authorizing Provider  albuterol (PROVENTIL HFA;VENTOLIN HFA) 108 (90 BASE) MCG/ACT inhaler Inhale 2 puffs into the lungs every 6 (six) hours as needed for wheezing or shortness of breath.    Historical Provider, MD  albuterol (PROVENTIL) (2.5 MG/3ML) 0.083% nebulizer solution Take 2.5 mg by nebulization every 6  (six) hours as needed for wheezing or shortness of breath.    Historical Provider, MD  glycopyrrolate (ROBINUL) 2 MG tablet Take 0.5-1 tablets (1-2 mg total) by mouth 3 (three) times daily as needed. 11/11/13   Lisa A Leftwich-Kirby, CNM  metroNIDAZOLE (METROGEL) 0.75 % vaginal gel Place 1 Applicatorful vaginally at bedtime. Apply one applicatorful to vagina at bedtime for 5 days 11/10/13   Kathie Dike Leftwich-Kirby, CNM  Multiple Vitamin (MULTIVITAMIN WITH MINERALS) TABS tablet Take 1 tablet by mouth daily.    Historical Provider, MD  nitrofurantoin, macrocrystal-monohydrate, (MACROBID) 100 MG capsule Take 1 capsule (100 mg total) by mouth 2 (two) times daily. 11/10/13   Lattie Haw A Leftwich-Kirby, CNM  promethazine (PHENERGAN) 25 MG tablet Take 0.5-1 tablets (12.5-25 mg total) by mouth every 6 (six) hours as needed for nausea or vomiting. 11/10/13   Lattie Haw A Leftwich-Kirby, CNM   BP 115/75  Pulse 99  Temp(Src) 98.3 F (36.8 C) (Oral)  Resp 16  SpO2 100%  LMP 08/30/2013 Physical Exam  Nursing note and vitals reviewed. Constitutional: She is oriented to person, place, and time. She appears well-developed and well-nourished.  HENT:  Head: Normocephalic and atraumatic.  Eyes: EOM are normal. Pupils are equal, round, and reactive to light.  Neck: Normal range of motion.  Cardiovascular: Normal rate and normal heart sounds.   Pulmonary/Chest: Effort normal.  Abdominal: Soft. She exhibits no distension.  Genitourinary: Vaginal discharge found.  Musculoskeletal: Normal range of motion.  Neurological: She is alert and oriented  to person, place, and time.  Skin: Skin is warm.  Psychiatric: She has a normal mood and affect.    ED Course  Procedures (including critical care time) Labs Review Labs Reviewed  POCT URINALYSIS DIP (DEVICE) - Abnormal; Notable for the following:    Leukocytes, UA SMALL (*)    All other components within normal limits    Imaging Review No results found.   MDM I suspect  yeast.  Pt saw Dr. Ruthann Cancer today and had   1. Yeast vaginitis    Cultures pending    Fransico Meadow, PA-C 02/22/14 1849

## 2014-02-23 ENCOUNTER — Encounter (HOSPITAL_COMMUNITY): Payer: Self-pay | Admitting: *Deleted

## 2014-02-23 ENCOUNTER — Inpatient Hospital Stay (HOSPITAL_COMMUNITY)
Admission: AD | Admit: 2014-02-23 | Discharge: 2014-02-23 | Disposition: A | Discharge status: Home / Self Care | Payer: Medicaid Other | Source: Ambulatory Visit | Attending: Obstetrics | Admitting: Obstetrics

## 2014-02-23 ENCOUNTER — Inpatient Hospital Stay (HOSPITAL_COMMUNITY): Payer: Medicaid Other

## 2014-02-23 DIAGNOSIS — W19XXXA Unspecified fall, initial encounter: Secondary | ICD-10-CM

## 2014-02-23 DIAGNOSIS — Y92009 Unspecified place in unspecified non-institutional (private) residence as the place of occurrence of the external cause: Secondary | ICD-10-CM | POA: Diagnosis not present

## 2014-02-23 DIAGNOSIS — Y999 Unspecified external cause status: Secondary | ICD-10-CM | POA: Diagnosis not present

## 2014-02-23 DIAGNOSIS — O99891 Other specified diseases and conditions complicating pregnancy: Secondary | ICD-10-CM | POA: Insufficient documentation

## 2014-02-23 DIAGNOSIS — O9989 Other specified diseases and conditions complicating pregnancy, childbirth and the puerperium: Principal | ICD-10-CM

## 2014-02-23 DIAGNOSIS — IMO0002 Reserved for concepts with insufficient information to code with codable children: Secondary | ICD-10-CM

## 2014-02-23 DIAGNOSIS — J45909 Unspecified asthma, uncomplicated: Secondary | ICD-10-CM

## 2014-02-23 MED ORDER — CYCLOBENZAPRINE HCL 10 MG PO TABS: 10.0000 mg | ORAL_TABLET | Freq: Three times a day (TID) | ORAL | Status: DC | PRN

## 2014-02-23 MED ORDER — ACETAMINOPHEN 500 MG PO TABS
1000.0000 mg | ORAL_TABLET | Freq: Once | ORAL | Status: DC
  Filled 2014-02-23: qty 2
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
## 2014-02-23 NOTE — Discharge Instructions (Signed)
Placental Abruption Warning signs/what to watch for: Your placenta is the organ that nourishes your unborn baby (fetus). Your baby gets his or her blood supply and nutrients through your placenta. It is your baby's life support system. It is attached to the inside of your uterus until after your baby is born.  Placental abruption is when the placenta partly or completely separates from the uterus before your baby is born. This is rare, but it can happen any time after 20 weeks of pregnancy. A small separation may not cause problems, but a large separation may be dangerous for you and your baby. CAUSES  Most of the time the cause of a placental abruption is unknown. Though it is rare, a placental abruption can be caused by:   An abdominal injury.   The baby turning from a buttocks-first position (breech presentation) or a sideways position (transverse) to a headfirst position (cephalic).   Delivering the first of multiple babies (twins, triplets, or more).   Sudden loss of amniotic fluid (premature rupture of the membranes).   An abnormally short umbilical cord. RISK FACTORS Some risk factors make a placental abruption more likely, including:  History of placental abruption.  High blood pressure (hypertension).  Smoking.  Alcohol intake.  Blood clotting problems.  Too much amniotic fluid.  Having had multiples (twins or triplets or more).  Seizures and convulsions.  Diabetes mellitus.  Having had more than four children.  Age 30 years or older.  Illegal drug use.  Injury to your abdomen. SIGNS AND SYMPTOMS  A small placental abruption may not cause symptoms. If you do have symptoms, they may include:  Mild abdominal pain.  Slight vaginal bleeding. Symptoms of severe placental abruption depend on the size of the separation and the stage of pregnancy. Symptoms may include:   Sudden pain in your uterus.  Abdominal pain.  Vaginal bleeding.  Tender  uterus.  Severe abdominal pain with tenderness.  Continual contractions of your uterus.  Back pain.  Weakness, light-headedness. DIAGNOSIS  Placental abruption is suspected when a pregnant woman develops sudden pain in her uterus. The health care provider will check whether the uterus is very tender, hard, and enlarging and whether the baby has an abnormal heart rate or rhythm. Ultrasonography (commonly called an ultrasound) will be done. TREATMENT  Placental abruption is usually an emergency. It requires treatment right away. Your treatment will depend on:   The amount of bleeding.  Whether you or you baby are in distress.  The stage of your pregnancy.  The maturity of the baby. Treatment for partial separation of the placenta is bed rest and close observation. You also may need a blood transfusion or to receive fluids through an IV tube. Treatment for complete placental separation is delivery of your baby. You may have a cesarean delivery if your baby is in distress. HOME CARE INSTRUCTIONS   Only take medicines as directed by your health care provider.  Arrange for help at home before and after you deliver the baby, especially if you had a cesarean delivery or lost a lot of blood.  Get plenty of rest and sleep.  Do not have sexual intercourse until your health care provider says it is okay.  Do not use tampons or douche unless your health care provider says it is okay. SEEK MEDICAL CARE IF:  You have light vaginal bleeding or spotting.  You have any type of trauma, such as a fall or jolt during an accident.  You are having  trouble avoiding drugs, alcohol, or smoking. SEEK IMMEDIATE MEDICAL CARE IF:  You have vaginal bleeding.  You have abdominal pain.  You have continuous uterine contractions.  You have a hard, tender uterus.  You do not feel the baby move, or the baby moves very little. MAKE SURE YOU:  Understand these instructions.  Will watch your  condition.  Will get help right away if you are not doing well or get worse. Document Released: 07/14/2005 Document Revised: 07/19/2013 Document Reviewed: 05/06/2013 [facility] Patient Information 2015 [place], [place]. This information is not intended to replace advice given to you by your health care provider. Make sure you discuss any questions you have with your health care provider.  Domestic Abuse You are being battered or abused if someone close to you hits, pushes, or physically hurts you in any way. You also are being abused if you are forced into activities. You are being sexually abused if you are forced to have sexual contact of any kind. You are being emotionally abused if you are made to feel worthless or if you are constantly threatened. It is important to remember that help is available. No one has the right to abuse you. PREVENTION OF FURTHER ABUSE  Learn the warning signs of danger. This varies with situations but may include: the use of alcohol, threats, isolation from friends and family, or forced sexual contact. Leave if you feel that violence is going to occur.  If you are attacked or beaten, report it to the police so the abuse is documented. You do not have to press charges. The police can protect you while you or the attackers are leaving. Get the officer's name and badge number and a copy of the report.  Find someone you can trust and tell them what is happening to you: your caregiver, a nurse, clergy member, close friend or family member. Feeling ashamed is natural, but remember that you have done nothing wrong. No one deserves abuse.  It is important to develop a safety plan if you decide to leave the relationship. You may be at risk of harm if your abuser knows you are leaving. Include the following steps in your plan:  Keep extra clothing for you and your children, medicines, money, important phone numbers and papers, and an extra set of car and house keys at a friend's  or neighbor's house.  Tell a supportive friend or family member that you may show up at any time of day or night in an emergency.  If you do not have a close friend or family member, make a list of other safe places to go, such as shelters and crisis centers. Keep an abuse hotline number available.  Consider filing a restraining order if your abuser stalks, harasses, or threatens you in any way. Document Released: 07/11/2000 Document Revised: 10/06/2011 Document Reviewed: 09/19/2010 [facility] Patient Information 2015 [place], [place]. This information is not intended to replace advice given to you by your health care provider. Make sure you discuss any questions you have with your health care provider.
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
## 2014-03-27 ENCOUNTER — Encounter (HOSPITAL_COMMUNITY): Payer: Self-pay | Admitting: *Deleted

## 2014-03-27 ENCOUNTER — Inpatient Hospital Stay (HOSPITAL_COMMUNITY)
Admission: AD | Admit: 2014-03-27 | Discharge: 2014-03-27 | Disposition: A | Discharge status: Home / Self Care | Payer: Medicaid Other | Source: Ambulatory Visit | Attending: Obstetrics | Admitting: Obstetrics

## 2014-03-27 DIAGNOSIS — M25569 Pain in unspecified knee: Secondary | ICD-10-CM | POA: Insufficient documentation

## 2014-03-27 DIAGNOSIS — S85909A Unspecified injury of unspecified blood vessel at lower leg level, unspecified leg, initial encounter: Secondary | ICD-10-CM

## 2014-03-27 DIAGNOSIS — Y929 Unspecified place or not applicable: Secondary | ICD-10-CM | POA: Diagnosis not present

## 2014-03-27 DIAGNOSIS — O9989 Other specified diseases and conditions complicating pregnancy, childbirth and the puerperium: Principal | ICD-10-CM

## 2014-03-27 DIAGNOSIS — S3981XA Other specified injuries of abdomen, initial encounter: Secondary | ICD-10-CM

## 2014-03-27 DIAGNOSIS — R109 Unspecified abdominal pain: Secondary | ICD-10-CM

## 2014-03-27 DIAGNOSIS — M79609 Pain in unspecified limb: Secondary | ICD-10-CM | POA: Diagnosis not present

## 2014-03-27 DIAGNOSIS — M25562 Pain in left knee: Secondary | ICD-10-CM

## 2014-03-27 DIAGNOSIS — O99891 Other specified diseases and conditions complicating pregnancy: Secondary | ICD-10-CM

## 2014-03-27 LAB — URINALYSIS, ROUTINE W REFLEX MICROSCOPIC
Bilirubin Urine: NEGATIVE
Glucose, UA: NEGATIVE mg/dL
Hgb urine dipstick: NEGATIVE
Ketones, ur: 15 mg/dL — AB
Nitrite: NEGATIVE
Protein, ur: NEGATIVE mg/dL
Specific Gravity, Urine: 1.025 (ref 1.005–1.030)
Urobilinogen, UA: 0.2 mg/dL (ref 0.0–1.0)
pH: 6 (ref 5.0–8.0)

## 2014-03-27 LAB — URINE MICROSCOPIC-ADD ON

## 2014-03-27 MED ORDER — ACETAMINOPHEN-CODEINE #3 300-30 MG PO TABS: 1.0000 | ORAL_TABLET | Freq: Four times a day (QID) | ORAL | Status: DC | PRN
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
## 2014-03-27 NOTE — MAU Note (Signed)
Lower abd cramping since Saturday, was in [place] on Saturday - has not been medically evaluated.  Having pain in L leg, shouder & neck.  Denies bleeding, had small amount of ? Fluid leaking last night, none today.

## 2014-03-30 LAB — URINE CULTURE: Colony Count: 75000

## 2014-04-11 LAB — OB RESULTS CONSOLE GC/CHLAMYDIA
CHLAMYDIA, DNA PROBE: NEGATIVE
Gonorrhea: NEGATIVE

## 2014-04-15 ENCOUNTER — Inpatient Hospital Stay (HOSPITAL_COMMUNITY)
Admission: AD | Admit: 2014-04-15 | Discharge: 2014-04-15 | Disposition: A | Discharge status: Home / Self Care | Payer: Medicaid Other | Source: Ambulatory Visit | Attending: Obstetrics | Admitting: Obstetrics

## 2014-04-15 ENCOUNTER — Encounter (HOSPITAL_COMMUNITY): Payer: Self-pay | Admitting: *Deleted

## 2014-04-15 DIAGNOSIS — O2390 Unspecified genitourinary tract infection in pregnancy, unspecified trimester: Secondary | ICD-10-CM | POA: Diagnosis not present

## 2014-04-15 DIAGNOSIS — R109 Unspecified abdominal pain: Secondary | ICD-10-CM | POA: Diagnosis present

## 2014-04-15 DIAGNOSIS — J45909 Unspecified asthma, uncomplicated: Secondary | ICD-10-CM | POA: Diagnosis not present

## 2014-04-15 DIAGNOSIS — N390 Urinary tract infection, site not specified: Secondary | ICD-10-CM | POA: Insufficient documentation

## 2014-04-15 DIAGNOSIS — O2343 Unspecified infection of urinary tract in pregnancy, third trimester: Secondary | ICD-10-CM

## 2014-04-15 DIAGNOSIS — O4700 False labor before 37 completed weeks of gestation, unspecified trimester: Secondary | ICD-10-CM | POA: Insufficient documentation

## 2014-04-15 LAB — URINALYSIS, ROUTINE W REFLEX MICROSCOPIC
Bilirubin Urine: NEGATIVE
Glucose, UA: NEGATIVE mg/dL
Hgb urine dipstick: NEGATIVE
Ketones, ur: NEGATIVE mg/dL
Nitrite: NEGATIVE
Protein, ur: NEGATIVE mg/dL
Specific Gravity, Urine: <1.005 — ABNORMAL LOW (ref 1.005–1.030)
Urobilinogen, UA: 0.2 mg/dL (ref 0.0–1.0)
pH: 6 (ref 5.0–8.0)

## 2014-04-15 LAB — CBC
HCT: 30.6 % — ABNORMAL LOW (ref 36.0–46.0)
Hemoglobin: 10.4 g/dL — ABNORMAL LOW (ref 12.0–15.0)
MCH: 31.5 pg (ref 26.0–34.0)
MCHC: 34 g/dL (ref 30.0–36.0)
MCV: 92.7 fL (ref 78.0–100.0)
Platelets: 157 10*3/uL (ref 150–400)
RBC: 3.3 MIL/uL — ABNORMAL LOW (ref 3.87–5.11)
RDW: 13.1 % (ref 11.5–15.5)
WBC: 13.9 10*3/uL — ABNORMAL HIGH (ref 4.0–10.5)

## 2014-04-15 LAB — WET PREP, GENITAL
Clue Cells Wet Prep HPF POC: NONE SEEN
Trich, Wet Prep: NONE SEEN
Yeast Wet Prep HPF POC: NONE SEEN

## 2014-04-15 LAB — FETAL FIBRONECTIN: Fetal Fibronectin: NEGATIVE

## 2014-04-15 LAB — URINE MICROSCOPIC-ADD ON

## 2014-04-15 MED ORDER — NITROFURANTOIN MONOHYD MACRO 100 MG PO CAPS: 100.0000 mg | ORAL_CAPSULE | Freq: Two times a day (BID) | ORAL | Status: DC
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
## 2014-04-15 NOTE — MAU Note (Signed)
Started having cramping last night, this morning pain got worse

## 2014-04-17 LAB — GC/CHLAMYDIA PROBE AMP
CT Probe RNA: NEGATIVE
GC Probe RNA: NEGATIVE

## 2014-05-07 LAB — OB RESULTS CONSOLE GC/CHLAMYDIA
Chlamydia: NEGATIVE
Gonorrhea: NEGATIVE

## 2014-05-07 LAB — OB RESULTS CONSOLE GBS: STREP GROUP B AG: POSITIVE

## 2014-05-29 ENCOUNTER — Encounter (HOSPITAL_COMMUNITY): Payer: Self-pay | Admitting: *Deleted

## 2014-06-07 ENCOUNTER — Inpatient Hospital Stay (HOSPITAL_COMMUNITY): Payer: Medicaid Other | Admitting: Anesthesiology

## 2014-06-07 ENCOUNTER — Inpatient Hospital Stay (HOSPITAL_COMMUNITY)
Admission: AD | Admit: 2014-06-07 | Discharge: 2014-06-09 | DRG: 774 | Disposition: A | Discharge status: Home / Self Care | Payer: Medicaid Other | Source: Ambulatory Visit | Attending: Obstetrics | Admitting: Obstetrics

## 2014-06-07 ENCOUNTER — Encounter (HOSPITAL_COMMUNITY): Payer: Self-pay | Admitting: *Deleted

## 2014-06-07 DIAGNOSIS — Z3A40 40 weeks gestation of pregnancy: Secondary | ICD-10-CM | POA: Diagnosis present

## 2014-06-07 DIAGNOSIS — A6004 Herpesviral vulvovaginitis: Secondary | ICD-10-CM | POA: Diagnosis present

## 2014-06-07 DIAGNOSIS — O4693 Antepartum hemorrhage, unspecified, third trimester: Secondary | ICD-10-CM

## 2014-06-07 DIAGNOSIS — O9832 Other infections with a predominantly sexual mode of transmission complicating childbirth: Secondary | ICD-10-CM | POA: Diagnosis present

## 2014-06-07 DIAGNOSIS — Z789 Other specified health status: Secondary | ICD-10-CM | POA: Diagnosis present

## 2014-06-07 DIAGNOSIS — A6000 Herpesviral infection of urogenital system, unspecified: Secondary | ICD-10-CM | POA: Diagnosis present

## 2014-06-07 DIAGNOSIS — O99824 Streptococcus B carrier state complicating childbirth: Secondary | ICD-10-CM | POA: Diagnosis present

## 2014-06-07 DIAGNOSIS — O3421 Maternal care for scar from previous cesarean delivery: Secondary | ICD-10-CM | POA: Diagnosis present

## 2014-06-07 LAB — CBC
HCT: 33.4 % — ABNORMAL LOW (ref 36.0–46.0)
Hemoglobin: 11.2 g/dL — ABNORMAL LOW (ref 12.0–15.0)
MCH: 30.7 pg (ref 26.0–34.0)
MCHC: 33.5 g/dL (ref 30.0–36.0)
MCV: 91.5 fL (ref 78.0–100.0)
PLATELETS: 206 10*3/uL (ref 150–400)
RBC: 3.65 MIL/uL — AB (ref 3.87–5.11)
RDW: 14 % (ref 11.5–15.5)
WBC: 14.1 10*3/uL — ABNORMAL HIGH (ref 4.0–10.5)

## 2014-06-07 LAB — TYPE AND SCREEN
ABO/RH(D): B POS
ANTIBODY SCREEN: NEGATIVE

## 2014-06-07 LAB — RPR

## 2014-06-07 MED ORDER — BUTORPHANOL TARTRATE 1 MG/ML IJ SOLN
1.0000 mg | INTRAMUSCULAR | Status: DC | PRN
  Administered 2014-06-07: 1 mg via INTRAVENOUS
  Filled 2014-06-07: qty 1

## 2014-06-07 MED ORDER — LACTATED RINGERS IV SOLN
500.0000 mL | Freq: Once | INTRAVENOUS | Status: AC
  Administered 2014-06-07: 500 mL via INTRAVENOUS

## 2014-06-07 MED ORDER — LACTATED RINGERS IV SOLN
INTRAVENOUS | Status: DC
  Administered 2014-06-07: 17:00:00 via INTRAUTERINE

## 2014-06-07 MED ORDER — LACTATED RINGERS IV SOLN: 500.0000 mL | INTRAVENOUS | Status: DC | PRN

## 2014-06-07 MED ORDER — OXYTOCIN BOLUS FROM INFUSION: 500.0000 mL | INTRAVENOUS | Status: DC

## 2014-06-07 MED ORDER — OXYCODONE-ACETAMINOPHEN 5-325 MG PO TABS: 2.0000 | ORAL_TABLET | ORAL | Status: DC | PRN

## 2014-06-07 MED ORDER — DIPHENHYDRAMINE HCL 50 MG/ML IJ SOLN: 12.5000 mg | INTRAMUSCULAR | Status: DC | PRN

## 2014-06-07 MED ORDER — PENICILLIN G POTASSIUM 5000000 UNITS IJ SOLR
2.5000 10*6.[IU] | INTRAVENOUS | Status: DC
  Administered 2014-06-07 (×3): 2.5 10*6.[IU] via INTRAVENOUS
  Filled 2014-06-07 (×5): qty 2.5

## 2014-06-07 MED ORDER — OXYTOCIN 40 UNITS IN LACTATED RINGERS INFUSION - SIMPLE MED
1.0000 m[IU]/min | INTRAVENOUS | Status: DC
  Administered 2014-06-07: 2 m[IU]/min via INTRAVENOUS
  Filled 2014-06-07: qty 1000

## 2014-06-07 MED ORDER — LACTATED RINGERS IV SOLN
INTRAVENOUS | Status: DC
  Administered 2014-06-07: 09:00:00 via INTRAVENOUS

## 2014-06-07 MED ORDER — OXYTOCIN 40 UNITS IN LACTATED RINGERS INFUSION - SIMPLE MED: 62.5000 mL/h | INTRAVENOUS | Status: DC

## 2014-06-07 MED ORDER — FLEET ENEMA 7-19 GM/118ML RE ENEM: 1.0000 | ENEMA | RECTAL | Status: DC | PRN

## 2014-06-07 MED ORDER — TERBUTALINE SULFATE 1 MG/ML IJ SOLN: 0.2500 mg | Freq: Once | INTRAMUSCULAR | Status: AC | PRN

## 2014-06-07 MED ORDER — LACTATED RINGERS IV SOLN: INTRAVENOUS | Status: DC

## 2014-06-07 MED ORDER — OXYCODONE-ACETAMINOPHEN 5-325 MG PO TABS: 1.0000 | ORAL_TABLET | ORAL | Status: DC | PRN

## 2014-06-07 MED ORDER — CITRIC ACID-SODIUM CITRATE 334-500 MG/5ML PO SOLN
30.0000 mL | ORAL | Status: DC | PRN
  Filled 2014-06-07: qty 15

## 2014-06-07 MED ORDER — EPHEDRINE 5 MG/ML INJ
10.0000 mg | INTRAVENOUS | Status: DC | PRN
  Filled 2014-06-07: qty 2

## 2014-06-07 MED ORDER — LIDOCAINE HCL (PF) 1 % IJ SOLN
30.0000 mL | INTRAMUSCULAR | Status: DC | PRN
  Filled 2014-06-07: qty 30

## 2014-06-07 MED ORDER — PHENYLEPHRINE 40 MCG/ML (10ML) SYRINGE FOR IV PUSH (FOR BLOOD PRESSURE SUPPORT)
80.0000 ug | PREFILLED_SYRINGE | INTRAVENOUS | Status: DC | PRN
  Filled 2014-06-07: qty 10
  Filled 2014-06-07: qty 2

## 2014-06-07 MED ORDER — PENICILLIN G POTASSIUM 5000000 UNITS IJ SOLR
5.0000 10*6.[IU] | Freq: Once | INTRAVENOUS | Status: AC
  Administered 2014-06-07: 5 10*6.[IU] via INTRAVENOUS
  Filled 2014-06-07: qty 5

## 2014-06-07 MED ORDER — FENTANYL 2.5 MCG/ML BUPIVACAINE 1/10 % EPIDURAL INFUSION (WH - ANES)
14.0000 mL/h | INTRAMUSCULAR | Status: DC | PRN
  Administered 2014-06-07: 14 mL/h via EPIDURAL
  Filled 2014-06-07: qty 125

## 2014-06-07 MED ORDER — LIDOCAINE HCL (PF) 1 % IJ SOLN
INTRAMUSCULAR | Status: DC | PRN
  Administered 2014-06-07 (×5): 4 mL

## 2014-06-07 MED ORDER — ACETAMINOPHEN 325 MG PO TABS: 650.0000 mg | ORAL_TABLET | ORAL | Status: DC | PRN

## 2014-06-07 MED ORDER — ONDANSETRON HCL 4 MG/2ML IJ SOLN
4.0000 mg | Freq: Four times a day (QID) | INTRAMUSCULAR | Status: DC | PRN
Start: 2014-06-07 — End: 2014-06-08

## 2014-06-07 MED ORDER — PHENYLEPHRINE 40 MCG/ML (10ML) SYRINGE FOR IV PUSH (FOR BLOOD PRESSURE SUPPORT)
80.0000 ug | PREFILLED_SYRINGE | INTRAVENOUS | Status: DC | PRN
  Administered 2014-06-07 (×2): 80 ug via INTRAVENOUS
  Filled 2014-06-07: qty 2

## 2014-06-07 NOTE — Anesthesia Procedure Notes (Signed)
Epidural Patient location during procedure: OB Start time: 06/07/2014 3:16 PM  Staffing Anesthesiologist: Ariz Terrones Performed by: anesthesiologist   Preanesthetic Checklist Completed: patient identified, site marked, surgical consent, pre-op evaluation, timeout performed, IV checked, risks and benefits discussed and monitors and equipment checked  Epidural Patient position: sitting Prep: site prepped and draped and DuraPrep Patient monitoring: continuous pulse ox and blood pressure Approach: midline Location: L2-L3 Injection technique: LOR air  Needle:  Needle type: Tuohy  Needle gauge: 17 G Needle length: 9 cm and 9 Needle insertion depth: 5 cm cm Catheter type: closed end flexible Catheter size: 19 Gauge Catheter at skin depth: 10 cm Test dose: negative  Assessment Events: blood aspirated, injection not painful, no injection resistance, negative IV test and no paresthesia  Additional Notes Discussed risk of headache, infection, bleeding, nerve injury and failed or incomplete block.  Patient voices understanding and wishes to proceed.  Epidural placed easily on first attempt.  No paresthesia.  Blood aspirated from catheter - test dose negative.  Catheter replaced - same thing.  Catheter replaced a third time (went up a level through same skin puncture) with no blood aspirate.  ALL test doses negative.  Patient tolerated procedure well.  Charlton Haws, MDReason for block:procedure for pain

## 2014-06-07 NOTE — MAU Note (Signed)
Pt came in by EMS C/O gush of bloody fluid an hour ago, irregular uc's started last night.  Denies hx of placenta previa.

## 2014-06-07 NOTE — MAU Provider Note (Signed)
Ms. Caitlin Greer is a 28 y.o. female G2P1001 at [redacted]w[redacted]d  who presents with ? ROM and vaginal bleeding. She started having leaking of fluid and bleeding this morning and called 911; she arrived via EMS. Patient reports good fetal movement. She describes contraction like pain that comes and goes.  History of cesarean section, patient desires TOLAC.   RN note:  Expand All Collapse All   Pt came in by EMS C/O gush of bloody fluid an hour ago, irregular uc's started last night. Denies hx of placenta previa.     Objective: GENERAL: Well-developed, well-nourished female in no acute distress.  HEENT: Normocephalic, atraumatic.   LUNGS: Effort normal HEART: Regular rate  ABDOMEN: Soft, moderate to palpation; complete relaxation of uterus between contractions  SKIN: Warm, dry and without erythema PSYCH: Normal mood and affect  Filed Vitals:   06/07/14 1330  BP: 97/47  Pulse: 73  Temp:   Resp: 20   Speculum exam: Vagina - Moderate amount of dark red blood pooling in the vaginal canal, golf ball size clot pulled from canal.  Cervix - +active bleeding  Bimanual exam: Dilation: 1 Effacement (%): 70 Station: -3 Presentation: Vertex Exam by:: Dr. Florina Ou present for exam.  Fetal Tracing: Baseline: 135 bpm Variability: Moderate  Accelerations: 15x15 Decelerations: none Toco: Q15 mins    MDM: Fern slide negative; large amount of reds cells on slide Contacted Dr.Marshall at 0855; patient to be admitted to Labor and delivery. Discussed physical exam, fetal tracing.   A: 1. Vaginal bleeding in pregnancy, third trimester   2.      Possible rupture of membranes at term    P:  Admit to Labor and Delivery per Dr. Ruthann Cancer. Rn to enter admit orders CBC LR infusion.   Darrelyn Hillock Rasch, NP 06/07/2014 1:45 PM

## 2014-06-07 NOTE — Anesthesia Preprocedure Evaluation (Signed)
Anesthesia Evaluation  Patient identified by MRN, date of birth, ID band Patient awake    Reviewed: Allergy & Precautions, H&P , NPO status , Patient's Chart, lab work & pertinent test results, reviewed documented beta blocker date and time   History of Anesthesia Complications Negative for: history of anesthetic complications  Airway Mallampati: II  TM Distance: >3 FB Neck ROM: full    Dental  (+) Teeth Intact   Pulmonary asthma (last inhaler use 8 months ago) ,  breath sounds clear to auscultation        Cardiovascular negative cardio ROS  Rhythm:regular Rate:Normal     Neuro/Psych negative neurological ROS  negative psych ROS   GI/Hepatic negative GI ROS, Neg liver ROS,   Endo/Other  negative endocrine ROS  Renal/GU negative Renal ROS     Musculoskeletal   Abdominal   Peds  Hematology negative hematology ROS (+)   Anesthesia Other Findings   Reproductive/Obstetrics (+) Pregnancy (h/o C/S x1, attempting VBAC)                             Anesthesia Physical Anesthesia Plan  ASA: II  Anesthesia Plan: Epidural   Post-op Pain Management:    Induction:   Airway Management Planned:   Additional Equipment:   Intra-op Plan:   Post-operative Plan:   Informed Consent: I have reviewed the patients History and Physical, chart, labs and discussed the procedure including the risks, benefits and alternatives for the proposed anesthesia with the patient or authorized representative who has indicated his/her understanding and acceptance.     Plan Discussed with:   Anesthesia Plan Comments:         Anesthesia Quick Evaluation

## 2014-06-07 NOTE — H&P (Signed)
This is Dr. Gracy Racer dictating the history and physical on  Caitlin Greer she is a 28 year old gravida 3 para 1011 at 6 weeks and a day previous C-section for nonreassuring fetal heart rate tracing became in this monitoring contracting and was not certain if her membranes abrupted a positive herpes with no all breaks she is on Valtrex 500 daily and positive GBS she is on penicillin her cervix is 1 cm 70% vertex -3 amniotomy performed the fluids clear she is having irregular contractions and she is on low-dose Pitocin past medical history Negative Past surgical history C-section 1 for nonreassuring fetal heart rate tracing Social history negative System review negative Physical exam well-developed female in labor HEENT negative Lungs clear to P&A Heart regular rhythm no murmurs no gallops Breasts negative Abdomen term Pelvic as described above Extremities negative

## 2014-06-08 ENCOUNTER — Encounter (HOSPITAL_COMMUNITY): Payer: Self-pay | Admitting: *Deleted

## 2014-06-08 LAB — CBC
HEMATOCRIT: 31 % — AB (ref 36.0–46.0)
HEMOGLOBIN: 10.5 g/dL — AB (ref 12.0–15.0)
MCH: 30.6 pg (ref 26.0–34.0)
MCHC: 33.9 g/dL (ref 30.0–36.0)
MCV: 90.4 fL (ref 78.0–100.0)
PLATELETS: 178 10*3/uL (ref 150–400)
RBC: 3.43 MIL/uL — AB (ref 3.87–5.11)
RDW: 13.7 % (ref 11.5–15.5)
WBC: 26.9 10*3/uL — AB (ref 4.0–10.5)

## 2014-06-08 MED ORDER — SENNOSIDES-DOCUSATE SODIUM 8.6-50 MG PO TABS
2.0000 | ORAL_TABLET | ORAL | Status: DC
  Administered 2014-06-09: 2 via ORAL
  Filled 2014-06-08: qty 2

## 2014-06-08 MED ORDER — OXYCODONE-ACETAMINOPHEN 5-325 MG PO TABS: 1.0000 | ORAL_TABLET | ORAL | Status: DC | PRN

## 2014-06-08 MED ORDER — LANOLIN HYDROUS EX OINT: TOPICAL_OINTMENT | CUTANEOUS | Status: DC | PRN

## 2014-06-08 MED ORDER — TETANUS-DIPHTH-ACELL PERTUSSIS 5-2.5-18.5 LF-MCG/0.5 IM SUSP: 0.5000 mL | Freq: Once | INTRAMUSCULAR | Status: DC

## 2014-06-08 MED ORDER — ZOLPIDEM TARTRATE 5 MG PO TABS: 5.0000 mg | ORAL_TABLET | Freq: Every evening | ORAL | Status: DC | PRN

## 2014-06-08 MED ORDER — WITCH HAZEL-GLYCERIN EX PADS: 1.0000 "application " | MEDICATED_PAD | CUTANEOUS | Status: DC | PRN

## 2014-06-08 MED ORDER — ONDANSETRON HCL 4 MG/2ML IJ SOLN: 4.0000 mg | INTRAMUSCULAR | Status: DC | PRN

## 2014-06-08 MED ORDER — SIMETHICONE 80 MG PO CHEW: 80.0000 mg | CHEWABLE_TABLET | ORAL | Status: DC | PRN

## 2014-06-08 MED ORDER — BENZOCAINE-MENTHOL 20-0.5 % EX AERO: 1.0000 "application " | INHALATION_SPRAY | CUTANEOUS | Status: DC | PRN

## 2014-06-08 MED ORDER — DIPHENHYDRAMINE HCL 25 MG PO CAPS: 25.0000 mg | ORAL_CAPSULE | Freq: Four times a day (QID) | ORAL | Status: DC | PRN

## 2014-06-08 MED ORDER — TETANUS-DIPHTH-ACELL PERTUSSIS 5-2.5-18.5 LF-MCG/0.5 IM SUSP
0.5000 mL | Freq: Once | INTRAMUSCULAR | Status: DC
  Filled 2014-06-08: qty 0.5

## 2014-06-08 MED ORDER — ONDANSETRON HCL 4 MG PO TABS: 4.0000 mg | ORAL_TABLET | ORAL | Status: DC | PRN

## 2014-06-08 MED ORDER — DIBUCAINE 1 % RE OINT: 1.0000 "application " | TOPICAL_OINTMENT | RECTAL | Status: DC | PRN

## 2014-06-08 MED ORDER — PRENATAL MULTIVITAMIN CH
1.0000 | ORAL_TABLET | Freq: Every day | ORAL | Status: DC
  Administered 2014-06-08: 1 via ORAL
  Filled 2014-06-08: qty 1

## 2014-06-08 MED ORDER — OXYCODONE-ACETAMINOPHEN 5-325 MG PO TABS: 2.0000 | ORAL_TABLET | ORAL | Status: DC | PRN

## 2014-06-08 MED ORDER — IBUPROFEN 600 MG PO TABS
600.0000 mg | ORAL_TABLET | Freq: Four times a day (QID) | ORAL | Status: DC
  Administered 2014-06-08 – 2014-06-09 (×4): 600 mg via ORAL
  Filled 2014-06-08 (×6): qty 1

## 2014-06-08 MED ORDER — FERROUS SULFATE 325 (65 FE) MG PO TABS
325.0000 mg | ORAL_TABLET | Freq: Two times a day (BID) | ORAL | Status: DC
  Administered 2014-06-08 – 2014-06-09 (×3): 325 mg via ORAL
  Filled 2014-06-08 (×3): qty 1

## 2014-06-08 NOTE — Plan of Care (Signed)
Problem: Phase I Progression Outcomes Goal: Pain controlled with appropriate interventions Outcome: Completed/Met Date Met:  06/08/14 Goal: VS, stable, temp < 100.4 degrees F Outcome: Completed/Met Date Met:  06/08/14 Goal: Initial discharge plan identified Outcome: Completed/Met Date Met:  06/08/14

## 2014-06-08 NOTE — Progress Notes (Signed)
UR chart review completed.  

## 2014-06-08 NOTE — Progress Notes (Signed)
Patient ID: Caitlin Greer, female   DOB: [birth year], 28 y.o.   MRN: [id number] Postpartum day one Vital signs normal Fundus firm Lochia moderate

## 2014-06-08 NOTE — Lactation Note (Signed)
This note was copied from the chart of Girl [facility]. Lactation Consultation Note  Patient Name: Girl Lillan Mccreadie [id number] Date: 06/08/2014 Reason for consult: Initial assessment Baby 14 hours of life. Mom reports that she nursed and formula-fed first baby and intends to do the same with this baby. Assisted mom to latch baby in football position to left breast. Baby has hiccups and is swallowing hard. Not able to get baby to latch. Discussed techniques for latching baby. Enc mom to offer lots of STS and hold baby upright for a while. Mom states that baby was a fast delivery. Discussed with mom how this can affect baby's BF when baby has a lot of fluid that keeps coming up baby's throat. Mom given [facility] brochure, aware of OP/BFSG, community resources, and [facility] phone line services after discharge.   Maternal Data Has patient been taught Hand Expression?: Yes Does the patient have breastfeeding experience prior to this delivery?: Yes  Feeding Feeding Type: Breast Fed  LATCH Score/Interventions Latch: Too sleepy or reluctant, no latch achieved, no sucking elicited. (Baby has hiccups and is swallowing hard.)  Audible Swallowing: None Intervention(s): Skin to skin Intervention(s): Hand expression  Type of Nipple: Everted at rest and after stimulation  Comfort (Breast/Nipple): Soft / non-tender     Hold (Positioning): Assistance needed to correctly position infant at breast and maintain latch.  LATCH Score: 5  Lactation Tools Discussed/Used     Consult Status Consult Status: Follow-up Date: 06/09/14 Follow-up type: In-patient    Inocente Salles 06/08/2014, 2:15 PM

## 2014-06-08 NOTE — Progress Notes (Signed)
   06/08/14 1600  Clinical Encounter Type  Visited With Patient and family together (older daughter )  Visit Type Spiritual support;Social support  Spiritual Encounters  Spiritual Needs Emotional   Caitlin Greer was in good spirits during this visit, stating appreciation for support from Caitlin Greer, Caitlin Greer.  Given that support and dtr's presence in room, we focused on more celebratory topics.  Honored pt's joy and gratitude for her girls, providing pastoral presence, reflective listening, and affirmation.  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  [place], [place]

## 2014-06-08 NOTE — Progress Notes (Signed)
   06/08/14 1000  Clinical Encounter Type  Visited With Patient not available;Health care provider   Attempted visit per referral from [place], RN for social support, but LCSW visiting.  Plan to consult with LCSW and then f/u as needed.  Please also page 24/7 as needs arise:  [phone].  Thank you.  [place], [place]
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
## 2014-06-08 NOTE — Plan of Care (Signed)
Problem: Discharge Progression Outcomes Goal: Activity appropriate for discharge plan Outcome: Completed/Met Date Met:  06/08/14 Goal: Tolerating diet Outcome: Completed/Met Date Met:  06/08/14 Goal: Pain controlled with appropriate interventions Outcome: Completed/Met Date Met:  06/08/14     

## 2014-06-08 NOTE — Anesthesia Postprocedure Evaluation (Signed)
Anesthesia Post Note  Patient: Caitlin Greer  Procedure(s) Performed: * No procedures listed *  Anesthesia type: Epidural  Patient location: Mother/Baby  Post pain: Pain level controlled  Post assessment: Post-op Vital signs reviewed  Last Vitals:  Filed Vitals:   06/08/14 0559  BP: 115/66  Pulse: 85  Temp: 37.1 C  Resp: 20    Post vital signs: Reviewed  Level of consciousness:alert  Complications: No apparent anesthesia complications

## 2014-06-08 NOTE — Plan of Care (Signed)
Problem: Consults Goal: Skin Care Protocol Initiated - if Braden Score 18 or less If consults are not indicated, leave blank or document N/A  Outcome: Not Applicable Date Met:  08/05/30 Goal: Nutrition Consult-if indicated Outcome: Not Applicable Date Met:  35/57/32 Goal: Diabetes Guidelines if Diabetic/Glucose > 140 If diabetic or lab glucose is > 140 mg/dl - Initiate Diabetes/Hyperglycemia Guidelines & Document Interventions  Outcome: Not Applicable Date Met:  20/25/42  Problem: Phase I Progression Outcomes Goal: Voiding adequately Outcome: Completed/Met Date Met:  06/08/14 Goal: OOB as tolerated unless otherwise ordered Outcome: Completed/Met Date Met:  06/08/14 Goal: Other Phase I Outcomes/Goals Outcome: Completed/Met Date Met:  06/08/14  Problem: Phase II Progression Outcomes Goal: Pain controlled on oral analgesia Outcome: Completed/Met Date Met:  06/08/14 Goal: Progress activity as tolerated unless otherwise ordered Outcome: Completed/Met Date Met:  06/08/14 Goal: Afebrile, VS remain stable Outcome: Completed/Met Date Met:  06/08/14 Goal: Tolerating diet Outcome: Completed/Met Date Met:  06/08/14 Goal: Other Phase II Outcomes/Goals Outcome: Completed/Met Date Met:  06/08/14  Problem: Discharge Progression Outcomes Goal: MMR given as ordered Outcome: Not Applicable Date Met:  70/62/37

## 2014-06-09 NOTE — Discharge Instructions (Signed)
Discharge instructions   You can wash your hair  Shower  Eat what you want  Drink what you want  See me in 6 weeks  Your ankles are going to swell more in the next 2 weeks than when pregnant  No sex for 6 weeks   Burley Kopka A, MD 06/09/2014

## 2014-06-09 NOTE — Progress Notes (Signed)
Patient ID: Caitlin Greer, female   DOB: 12/05/1985, 28 y.o.   MRN: [id number] Postpartum  Day 2 Vital signs normal Fundus firm Lochia moderate Legs negative home today

## 2014-06-09 NOTE — Discharge Summary (Signed)
Obstetric Discharge Summary Reason for Admission: onset of labor Prenatal Procedures: none Intrapartum Procedures: spontaneous vaginal delivery Postpartum Procedures: none Complications-Operative and Postpartum: none HEMOGLOBIN  Date Value Ref Range Status  06/08/2014 10.5* 12.0 - 15.0 g/dL Final   HCT  Date Value Ref Range Status  06/08/2014 31.0* 36.0 - 46.0 % Final    Physical Exam:  General: alert Lochia: appropriate Uterine Fundus: firm Incision: healing well DVT Evaluation: No evidence of DVT seen on physical exam.  Discharge Diagnoses: Term Pregnancy-delivered  Discharge Information: Date: 06/09/2014 Activity: pelvic rest Diet: routine Medications: Percocet Condition: improved Instructions: refer to practice specific booklet Discharge to: home Follow-up Information    Follow up with Caitlin Hamman, MD.   Specialty:  Obstetrics and Gynecology   Contact information:   [address] [place] [address] [phone]       Newborn Data: Live born female  Birth Weight: 6 lb 0.7 oz (2741 g) APGAR: 8, 9  Home with mother.  Caitlin Greer 06/09/2014, 6:38 AM
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
## 2014-06-15 ENCOUNTER — Emergency Department (HOSPITAL_COMMUNITY)
Admission: EM | Admit: 2014-06-15 | Discharge: 2014-06-16 | Disposition: A | Discharge status: Home / Self Care | Payer: Medicaid Other | Attending: Emergency Medicine | Admitting: Emergency Medicine

## 2014-06-15 ENCOUNTER — Encounter (HOSPITAL_COMMUNITY): Payer: Self-pay | Admitting: Emergency Medicine

## 2014-06-15 DIAGNOSIS — Z79899 Other long term (current) drug therapy: Secondary | ICD-10-CM | POA: Diagnosis not present

## 2014-06-15 DIAGNOSIS — R030 Elevated blood-pressure reading, without diagnosis of hypertension: Secondary | ICD-10-CM | POA: Diagnosis present

## 2014-06-15 DIAGNOSIS — I159 Secondary hypertension, unspecified: Secondary | ICD-10-CM | POA: Diagnosis not present

## 2014-06-15 DIAGNOSIS — J45909 Unspecified asthma, uncomplicated: Secondary | ICD-10-CM | POA: Diagnosis not present

## 2014-06-15 MED ORDER — IBUPROFEN 200 MG PO TABS
400.0000 mg | ORAL_TABLET | Freq: Once | ORAL | Status: AC
  Administered 2014-06-15: 400 mg via ORAL
  Filled 2014-06-15: qty 2

## 2014-06-15 MED ORDER — SODIUM CHLORIDE 0.9 % IV BOLUS (SEPSIS)
1000.0000 mL | Freq: Once | INTRAVENOUS | Status: AC
  Administered 2014-06-15: 1000 mL via INTRAVENOUS

## 2014-06-15 MED ORDER — MORPHINE SULFATE 4 MG/ML IJ SOLN
4.0000 mg | Freq: Once | INTRAMUSCULAR | Status: AC
  Administered 2014-06-16: 4 mg via INTRAVENOUS
  Filled 2014-06-15: qty 1

## 2014-06-15 MED ORDER — KETOROLAC TROMETHAMINE 30 MG/ML IJ SOLN
30.0000 mg | Freq: Once | INTRAMUSCULAR | Status: AC
  Administered 2014-06-16: 30 mg via INTRAVENOUS
  Filled 2014-06-15: qty 1

## 2014-06-15 MED ORDER — METOCLOPRAMIDE HCL 5 MG/ML IJ SOLN
10.0000 mg | Freq: Once | INTRAMUSCULAR | Status: DC
  Filled 2014-06-15: qty 2

## 2014-06-15 NOTE — ED Notes (Signed)
Pt. reports elevated blood pressure today with headache , denies head injury , alert and oriented , no nausea or vomitting .

## 2014-06-16 LAB — COMPREHENSIVE METABOLIC PANEL
ALT: 67 U/L — ABNORMAL HIGH (ref 0–35)
ANION GAP: 15 (ref 5–15)
AST: 32 U/L (ref 0–37)
Albumin: 3.4 g/dL — ABNORMAL LOW (ref 3.5–5.2)
Alkaline Phosphatase: 119 U/L — ABNORMAL HIGH (ref 39–117)
BUN: 11 mg/dL (ref 6–23)
CALCIUM: 9.2 mg/dL (ref 8.4–10.5)
CO2: 21 meq/L (ref 19–32)
Chloride: 105 mEq/L (ref 96–112)
Creatinine, Ser: 0.8 mg/dL (ref 0.50–1.10)
GLUCOSE: 91 mg/dL (ref 70–99)
Potassium: 3.6 mEq/L — ABNORMAL LOW (ref 3.7–5.3)
SODIUM: 141 meq/L (ref 137–147)
TOTAL PROTEIN: 7.7 g/dL (ref 6.0–8.3)
Total Bilirubin: 0.3 mg/dL (ref 0.3–1.2)

## 2014-06-16 LAB — URINE MICROSCOPIC-ADD ON

## 2014-06-16 LAB — URINALYSIS, ROUTINE W REFLEX MICROSCOPIC
Bilirubin Urine: NEGATIVE
GLUCOSE, UA: NEGATIVE mg/dL
KETONES UR: NEGATIVE mg/dL
Nitrite: NEGATIVE
PH: 7.5 (ref 5.0–8.0)
Protein, ur: 100 mg/dL — AB
SPECIFIC GRAVITY, URINE: 1.006 (ref 1.005–1.030)
Urobilinogen, UA: 0.2 mg/dL (ref 0.0–1.0)

## 2014-06-16 LAB — CBC
HEMATOCRIT: 36.9 % (ref 36.0–46.0)
Hemoglobin: 12.2 g/dL (ref 12.0–15.0)
MCH: 29.8 pg (ref 26.0–34.0)
MCHC: 33.1 g/dL (ref 30.0–36.0)
MCV: 90 fL (ref 78.0–100.0)
Platelets: 273 10*3/uL (ref 150–400)
RBC: 4.1 MIL/uL (ref 3.87–5.11)
RDW: 13.6 % (ref 11.5–15.5)
WBC: 6.3 10*3/uL (ref 4.0–10.5)

## 2014-06-16 MED ORDER — LABETALOL HCL 200 MG PO TABS: 200.0000 mg | ORAL_TABLET | ORAL | Status: DC

## 2014-06-16 MED ORDER — LABETALOL HCL 100 MG PO TABS: 100.0000 mg | ORAL_TABLET | Freq: Two times a day (BID) | ORAL | Status: DC

## 2014-06-16 MED ORDER — LABETALOL HCL 100 MG PO TABS
100.0000 mg | ORAL_TABLET | Freq: Once | ORAL | Status: AC
  Administered 2014-06-16: 100 mg via ORAL
  Filled 2014-06-16: qty 1

## 2014-06-16 MED ORDER — LABETALOL HCL 200 MG PO TABS: 200.0000 mg | ORAL_TABLET | Freq: Two times a day (BID) | ORAL | Status: DC

## 2014-06-16 NOTE — ED Provider Notes (Signed)
CSN: [id number]     Arrival date & time 06/15/14  1924 History   First MD Initiated Contact with Patient 06/15/14 2322     Chief Complaint  Patient presents with  . Hypertension      HPI Patient is 6 days postpartum and complains of high blood pressure.  She had no blood pressure issues with her recent pregnancy.  She states she checked her blood pressure today and a relative salicylate was 409/811.  She states she's had rather severe headache over the past couple days that seems to be improving at this time.  No neck pain.  No fevers or chills.  Denies nausea or vomiting.  Uneventful vaginal delivery 6 days ago.  Denies abdominal pain.  No chest pain or shortness of breath.  No other complaints.    Past Medical History  Diagnosis Date  . Asthma     managed with rescue inhaler only    Past Surgical History  Procedure Laterality Date  . Cesarean section     Family History  Problem Relation Age of Onset  . Asthma Mother    History  Substance Use Topics  . Smoking status: Never Smoker   . Smokeless tobacco: Not on file  . Alcohol Use: No   OB History    Gravida Para Term Preterm AB TAB SAB Ectopic Multiple Living   3 2 2  1 1    0 2     Review of Systems  All other systems reviewed and are negative.     Allergies  Review of patient's allergies indicates no known allergies.  Home Medications   Prior to Admission medications   Medication Sig Start Date End Date Taking? Authorizing Provider  acetaminophen (TYLENOL) 325 MG tablet Take 650 mg by mouth every 6 (six) hours as needed for mild pain.   Yes Historical Provider, MD  ibuprofen (ADVIL,MOTRIN) 200 MG tablet Take 200 mg by mouth every 6 (six) hours as needed for moderate pain.   Yes Historical Provider, MD  Prenatal Vit-Fe Fumarate-FA (PRENATAL MULTIVITAMIN) TABS tablet Take 1 tablet by mouth daily.   Yes Historical Provider, MD  labetalol (NORMODYNE) 200 MG tablet Take 1 tablet (200 mg total) by mouth 2 (two) times  daily. 06/16/14   Hoy Morn, MD   BP 121/75 mmHg  Pulse 63  Temp(Src) 98.4 F (36.9 C) (Oral)  Resp 18  Ht 5\' 7"  (1.702 m)  Wt 157 lb (71.215 kg)  BMI 24.58 kg/m2  SpO2 97% Physical Exam  Constitutional: She is oriented to person, place, and time. She appears well-developed and well-nourished. No distress.  HENT:  Head: Normocephalic and atraumatic.  Eyes: EOM are normal.  Neck: Normal range of motion.  Cardiovascular: Normal rate, regular rhythm and normal heart sounds.   Pulmonary/Chest: Effort normal and breath sounds normal.  Abdominal: Soft. She exhibits no distension. There is no tenderness.  Musculoskeletal: Normal range of motion.  Neurological: She is alert and oriented to person, place, and time.  Skin: Skin is warm and dry.  Psychiatric: She has a normal mood and affect. Judgment normal.  Nursing note and vitals reviewed.   ED Course  Procedures (including critical care time) Labs Review Labs Reviewed  URINALYSIS, ROUTINE W REFLEX MICROSCOPIC - Abnormal; Notable for the following:    Hgb urine dipstick LARGE (*)    Protein, ur 100 (*)    Leukocytes, UA SMALL (*)    All other components within normal limits  COMPREHENSIVE METABOLIC PANEL -  Abnormal; Notable for the following:    Potassium 3.6 (*)    Albumin 3.4 (*)    ALT 67 (*)    Alkaline Phosphatase 119 (*)    All other components within normal limits  URINE MICROSCOPIC-ADD ON - Abnormal; Notable for the following:    Squamous Epithelial / LPF FEW (*)    All other components within normal limits  CBC    Imaging Review No results found.   EKG Interpretation None      MDM   Final diagnoses:  Secondary hypertension, unspecified     Spoke with Dr Ruthann Cancer, OB who would like to start the patient on 200mg  of labetalol twice a day.  He wants to see her in the office on Wednesday, November 25.  Concern would be possible developing preeclampsia.  Her blood pressures seem to be normalizing more  in emergency department.     Hoy Morn, MD 06/16/14 [phone]

## 2014-07-28 DIAGNOSIS — D219 Benign neoplasm of connective and other soft tissue, unspecified: Secondary | ICD-10-CM

## 2014-07-28 HISTORY — DX: Benign neoplasm of connective and other soft tissue, unspecified: D21.9

## 2014-11-03 ENCOUNTER — Encounter (HOSPITAL_COMMUNITY): Payer: Self-pay | Admitting: *Deleted

## 2014-11-03 ENCOUNTER — Inpatient Hospital Stay (HOSPITAL_COMMUNITY)
Admission: AD | Admit: 2014-11-03 | Discharge: 2014-11-03 | Disposition: A | Discharge status: Home / Self Care | Payer: Medicaid Other | Source: Ambulatory Visit | Attending: Family Medicine | Admitting: Family Medicine

## 2014-11-03 DIAGNOSIS — N760 Acute vaginitis: Secondary | ICD-10-CM | POA: Insufficient documentation

## 2014-11-03 DIAGNOSIS — A499 Bacterial infection, unspecified: Secondary | ICD-10-CM

## 2014-11-03 DIAGNOSIS — B9689 Other specified bacterial agents as the cause of diseases classified elsewhere: Secondary | ICD-10-CM | POA: Diagnosis not present

## 2014-11-03 DIAGNOSIS — R109 Unspecified abdominal pain: Secondary | ICD-10-CM | POA: Diagnosis present

## 2014-11-03 HISTORY — DX: Unspecified infectious disease: B99.9

## 2014-11-03 HISTORY — DX: Benign neoplasm of connective and other soft tissue, unspecified: D21.9

## 2014-11-03 LAB — WET PREP, GENITAL
Trich, Wet Prep: NONE SEEN
YEAST WET PREP: NONE SEEN

## 2014-11-03 LAB — URINALYSIS, ROUTINE W REFLEX MICROSCOPIC
BILIRUBIN URINE: NEGATIVE
Glucose, UA: NEGATIVE mg/dL
HGB URINE DIPSTICK: NEGATIVE
KETONES UR: NEGATIVE mg/dL
LEUKOCYTES UA: NEGATIVE
Nitrite: NEGATIVE
Protein, ur: NEGATIVE mg/dL
Specific Gravity, Urine: >1.03 — ABNORMAL HIGH (ref 1.005–1.030)
Urobilinogen, UA: 1 mg/dL (ref 0.0–1.0)
pH: 5.5 (ref 5.0–8.0)

## 2014-11-03 LAB — POCT PREGNANCY, URINE: Preg Test, Ur: NEGATIVE

## 2014-11-03 MED ORDER — METRONIDAZOLE 500 MG PO TABS
500.0000 mg | ORAL_TABLET | Freq: Two times a day (BID) | ORAL | Status: DC
Start: 2014-11-03 — End: 2015-02-02

## 2014-11-03 NOTE — Discharge Instructions (Signed)
Primary Care Resources:  [facility]  [address] [place] [address] Ph [phone] Every 2nd Saturday 9am-12pm  [email or web address] FREE Services  - [facility]  [place] Ph [phone]  [place] Ph [phone]  [email or web address] $45 per visit/Walk-in only  - [facility]  [place] [address] Ph [phone]  1st & 3rd Saturday of each month 9:30am-12:30pm [email or web address] Sliding fee scale/Call to make an appointment  - [facility]  [address], [place] Ph [phone]  Hours Mon-Fri 9am-7pm & Sat 9am-1pm [email or web address] Visits start at $45 per visit/Call to make an appointment  - [place] [address] Ph [phone]  Hours Mon-Wed 8:30am-5pm & Thurs 8:30am-8pm $5 per visit/Call for an eligibility appointment    Bacterial Vaginosis Bacterial vaginosis is a vaginal infection that occurs when the normal balance of bacteria in the vagina is disrupted. It results from an overgrowth of certain bacteria. This is the most common vaginal infection in women of childbearing age. Treatment is important to prevent complications, especially in pregnant women, as it can cause a premature delivery. CAUSES  Bacterial vaginosis is caused by an increase in harmful bacteria that are normally present in smaller amounts in the vagina. Several different kinds of bacteria can cause bacterial vaginosis. However, the reason that the condition develops is not fully understood. RISK FACTORS Certain activities or behaviors can put you at an increased risk of developing bacterial vaginosis, including:  Having a new sex partner or multiple sex partners.  Douching.  Using an intrauterine device (IUD) for contraception. Women do not get bacterial vaginosis from toilet seats,  bedding, swimming pools, or contact with objects around them. SIGNS AND SYMPTOMS  Some women with bacterial vaginosis have no signs or symptoms. Common symptoms include:  Grey vaginal discharge.  A fishlike odor with discharge, especially after sexual intercourse.  Itching or burning of the vagina and vulva.  Burning or pain with urination. DIAGNOSIS  Your health care provider will take a medical history and examine the vagina for signs of bacterial vaginosis. A sample of vaginal fluid may be taken. Your health care provider will look at this sample under a microscope to check for bacteria and abnormal cells. A vaginal pH test may also be done.  TREATMENT  Bacterial vaginosis may be treated with antibiotic medicines. These may be given in the form of a pill or a vaginal cream. A second round of antibiotics may be prescribed if the condition comes back after treatment.  HOME CARE INSTRUCTIONS   Only take over-the-counter or prescription medicines as directed by your health care provider.  If antibiotic medicine was prescribed, take it as directed. Make sure you finish it even if you start to feel better.  Do not have sex until treatment is completed.  Tell all sexual partners that you have a vaginal infection. They should see their health care provider and be treated if they have problems, such as a mild rash or itching.  Practice safe sex by using condoms and only having one sex partner. SEEK MEDICAL CARE IF:   Your symptoms are not improving after 3 days of treatment.  You have increased discharge or pain.  You have a fever. MAKE SURE YOU:   Understand these instructions.  Will watch your condition.  Will get help right away if you  are not doing well or get worse. FOR MORE INFORMATION  Centers for Disease Control and Prevention, Division of STD Prevention: AppraiserFraud.fi American Sexual Health Association (ASHA): www.ashastd.org  Document Released: 07/14/2005 Document  Revised: 05/04/2013 Document Reviewed: 02/23/2013 [facility] Patient Information 2015 [place], [place]. This information is not intended to replace advice given to you by your health care provider. Make sure you discuss any questions you have with your health care provider.

## 2014-11-03 NOTE — MAU Note (Signed)
Cramping in lower abd/uterus, started last night.  Noted foul odor 3 days ago and a slight d/c.  ? UTI or BV

## 2014-11-03 NOTE — MAU Provider Note (Signed)
History     CSN: [id number]  Arrival date and time: 11/03/14 1039   First Provider Initiated Contact with Patient 11/03/14 1131      Chief Complaint  Patient presents with  . Vaginal Discharge  . Abdominal Cramping   HPI Caitlin Greer is a 28yo [id number] who presents for eval of vag odor and low pelvic cramping x 3d. Denies fever or bowel issues. Had a nl menses starting on 10/19/14. She is almost 5 mos PP from an SVD. Same partner x 2 years.  OB History    Gravida Para Term Preterm AB TAB SAB Ectopic Multiple Living   3 2 2  1 1    0 2      Past Medical History  Diagnosis Date  . Asthma     managed with rescue inhaler only   . Hypertension   . Infection     UTI  . Fibroid     Past Surgical History  Procedure Laterality Date  . Cesarean section    . Therapeutic abortion      Family History  Problem Relation Age of Onset  . Adopted: Yes  . Asthma Mother     History  Substance Use Topics  . Smoking status: Never Smoker   . Smokeless tobacco: Never Used  . Alcohol Use: Yes     Comment: occ    Allergies: No Known Allergies  Prescriptions prior to admission  Medication Sig Dispense Refill Last Dose  . acetaminophen (TYLENOL) 325 MG tablet Take 650 mg by mouth every 6 (six) hours as needed for mild pain.   06/15/2014 at Unknown time  . ibuprofen (ADVIL,MOTRIN) 200 MG tablet Take 200 mg by mouth every 6 (six) hours as needed for moderate pain.   06/15/2014 at Unknown time  . labetalol (NORMODYNE) 100 MG tablet Take 1 tablet (100 mg total) by mouth 2 (two) times daily. 60 tablet 0   . Prenatal Vit-Fe Fumarate-FA (PRENATAL MULTIVITAMIN) TABS tablet Take 1 tablet by mouth daily.   06/14/2014 at Unknown time    ROS Physical Exam   Blood pressure 127/99, pulse 68, temperature 98.5 F (36.9 C), temperature source Oral, resp. rate 16, height 5\' 7"  (1.702 m), weight 55.339 kg (122 lb), last menstrual period 10/19/2014, unknown if currently breastfeeding.  Physical Exam   Constitutional: She is oriented to person, place, and time. She appears well-developed.  HENT:  Head: Normocephalic.  Neck: Normal range of motion.  Cardiovascular: Normal rate.   Respiratory: Effort normal.  GI: Soft.  NT  Genitourinary: Vagina normal and uterus normal.  SE: copious thin whitish/grey d/c; cx NT, long  Musculoskeletal: Normal range of motion.  Neurological: She is alert and oriented to person, place, and time.  Skin: Skin is warm and dry.  Psychiatric: She has a normal mood and affect. Her behavior is normal. Thought content normal.   Microscopic wet-mount exam shows clue cells, white blood cells.  Urinalysis    Component Value Date/Time   COLORURINE YELLOW 11/03/2014 1110   APPEARANCEUR HAZY* 11/03/2014 1110   LABSPEC >1.030* 11/03/2014 1110   PHURINE 5.5 11/03/2014 1110   GLUCOSEU NEGATIVE 11/03/2014 1110   HGBUR NEGATIVE 11/03/2014 1110   BILIRUBINUR NEGATIVE 11/03/2014 1110   KETONESUR NEGATIVE 11/03/2014 1110   PROTEINUR NEGATIVE 11/03/2014 1110   UROBILINOGEN 1.0 11/03/2014 1110   NITRITE NEGATIVE 11/03/2014 1110   LEUKOCYTESUR NEGATIVE 11/03/2014 1110   UPT: neg   MAU Course  Procedures  GC/chlam collected  Assessment and Plan  Bacterial vaginosis  Rx Flagyl 500 BID x 7d Needs primary provider, esp due to cHTN (list given in d/c instructions)  Santiel Topper CNM 11/03/2014, 11:38 AM

## 2014-11-06 LAB — GC/CHLAMYDIA PROBE AMP (~~LOC~~) NOT AT ARMC
Chlamydia: NEGATIVE
NEISSERIA GONORRHEA: NEGATIVE

## 2015-01-30 ENCOUNTER — Inpatient Hospital Stay (HOSPITAL_COMMUNITY)
Admission: AD | Admit: 2015-01-30 | Discharge: 2015-01-30 | Discharge status: Against Medical Advice | Payer: Medicaid Other | Source: Ambulatory Visit | Attending: Family Medicine | Admitting: Family Medicine

## 2015-01-30 NOTE — MAU Note (Signed)
Pt called, not in lobby 

## 2015-01-30 NOTE — MAU Note (Signed)
Called pt into triage, she stated she had to take a phone call, went outside.  Came in a few minutes later to the lobby, still on phone.  Has now gone back outside.

## 2015-02-02 ENCOUNTER — Encounter (HOSPITAL_COMMUNITY): Payer: Self-pay | Admitting: *Deleted

## 2015-02-02 ENCOUNTER — Inpatient Hospital Stay (HOSPITAL_COMMUNITY)
Admission: AD | Admit: 2015-02-02 | Discharge: 2015-02-02 | Disposition: A | Discharge status: Home / Self Care | Payer: Medicaid Other | Source: Ambulatory Visit | Attending: Obstetrics and Gynecology | Admitting: Obstetrics and Gynecology

## 2015-02-02 DIAGNOSIS — N760 Acute vaginitis: Secondary | ICD-10-CM | POA: Diagnosis present

## 2015-02-02 DIAGNOSIS — B3731 Acute candidiasis of vulva and vagina: Secondary | ICD-10-CM

## 2015-02-02 DIAGNOSIS — Z3201 Encounter for pregnancy test, result positive: Secondary | ICD-10-CM | POA: Diagnosis not present

## 2015-02-02 DIAGNOSIS — Z331 Pregnant state, incidental: Secondary | ICD-10-CM

## 2015-02-02 DIAGNOSIS — B373 Candidiasis of vulva and vagina: Secondary | ICD-10-CM | POA: Diagnosis not present

## 2015-02-02 HISTORY — DX: Dermatitis, unspecified: L30.9

## 2015-02-02 LAB — URINE MICROSCOPIC-ADD ON

## 2015-02-02 LAB — URINALYSIS, ROUTINE W REFLEX MICROSCOPIC
Bilirubin Urine: NEGATIVE
Glucose, UA: NEGATIVE mg/dL
Hgb urine dipstick: NEGATIVE
KETONES UR: NEGATIVE mg/dL
NITRITE: NEGATIVE
PROTEIN: NEGATIVE mg/dL
Specific Gravity, Urine: >1.03 — ABNORMAL HIGH (ref 1.005–1.030)
UROBILINOGEN UA: 0.2 mg/dL (ref 0.0–1.0)
pH: 6 (ref 5.0–8.0)

## 2015-02-02 LAB — WET PREP, GENITAL
Clue Cells Wet Prep HPF POC: NONE SEEN
Trich, Wet Prep: NONE SEEN

## 2015-02-02 LAB — OB RESULTS CONSOLE GC/CHLAMYDIA: Gonorrhea: NEGATIVE

## 2015-02-02 LAB — POCT PREGNANCY, URINE: PREG TEST UR: POSITIVE — AB

## 2015-02-02 LAB — OB RESULTS CONSOLE HIV ANTIBODY (ROUTINE TESTING): HIV: NONREACTIVE

## 2015-02-02 MED ORDER — TERCONAZOLE 0.4 % VA CREA: 1.0000 | TOPICAL_CREAM | Freq: Every day | VAGINAL | Status: DC

## 2015-02-02 NOTE — MAU Note (Addendum)
Had not done a home test. Vag itching started 3 days ago.  Period was short and BP is low, that is why she thinks she might be preg

## 2015-02-02 NOTE — MAU Provider Note (Signed)
History     CSN: [id number]  Arrival date and time: 02/02/15 1620   First Provider Initiated Contact with Patient 02/02/15 1736      Chief Complaint  Patient presents with  . Possible pregnancy   . Vaginitis   HPI   [name] is a 29 y.o. female [phone] at Unknown presenting to MAU with vaginitis. Symptoms started 3 days ago and include itching. She has not tried anything over the counter for the symptoms.   She is also wanting to know if she is pregnant. She has had vaginal bleeding each month, which she thought was a period. The bleeding lasted only 4 days as apposed to 7 days.   Last time she had intercourse was 3 months.   OB History    Gravida Para Term Preterm AB TAB SAB Ectopic Multiple Living   5 2 2  2 1 1   0 2      Past Medical History  Diagnosis Date  . Asthma     managed with rescue inhaler only   . Infection     UTI  . Fibroid   . Hypertension     not currently on meds  . Eczema     Past Surgical History  Procedure Laterality Date  . Cesarean section    . Therapeutic abortion      Family History  Problem Relation Age of Onset  . Adopted: Yes    History  Substance Use Topics  . Smoking status: Never Smoker   . Smokeless tobacco: Never Used  . Alcohol Use: Yes     Comment: occ    Allergies: No Known Allergies  Prescriptions prior to admission  Medication Sig Dispense Refill Last Dose  . acetaminophen (TYLENOL) 325 MG tablet Take 650 mg by mouth every 6 (six) hours as needed for mild pain.   Past Week at Unknown time  . albuterol (PROVENTIL HFA;VENTOLIN HFA) 108 (90 BASE) MCG/ACT inhaler Inhale 1-2 puffs into the lungs every 6 (six) hours as needed for wheezing or shortness of breath.   prn  . ibuprofen (ADVIL,MOTRIN) 200 MG tablet Take 200 mg by mouth every 6 (six) hours as needed for moderate pain.   prn  . metroNIDAZOLE (FLAGYL) 500 MG tablet Take 1 tablet (500 mg total) by mouth 2 (two) times daily. 14 tablet 0   . Prenatal  Vit-Fe Fumarate-FA (PRENATAL MULTIVITAMIN) TABS tablet Take 1 tablet by mouth daily.   11/03/2014 at Unknown time   Results for orders placed or performed during the hospital encounter of 02/02/15 (from the past 48 hour(s))  Urinalysis, Routine w reflex microscopic (not at [facility])     Status: Abnormal   Collection Time: 02/02/15  4:59 PM  Result Value Ref Range   Color, Urine YELLOW YELLOW   APPearance CLEAR CLEAR   Specific Gravity, Urine >1.030 (H) 1.005 - 1.030   pH 6.0 5.0 - 8.0   Glucose, UA NEGATIVE NEGATIVE mg/dL   Hgb urine dipstick NEGATIVE NEGATIVE   Bilirubin Urine NEGATIVE NEGATIVE   Ketones, ur NEGATIVE NEGATIVE mg/dL   Protein, ur NEGATIVE NEGATIVE mg/dL   Urobilinogen, UA 0.2 0.0 - 1.0 mg/dL   Nitrite NEGATIVE NEGATIVE   Leukocytes, UA MODERATE (A) NEGATIVE  Urine microscopic-add on     Status: Abnormal   Collection Time: 02/02/15  4:59 PM  Result Value Ref Range   Squamous Epithelial / LPF MANY (A) RARE   WBC, UA 7-10 <3 WBC/hpf   RBC / HPF  3-6 <3 RBC/hpf   Bacteria, UA FEW (A) RARE   Urine-Other MUCOUS PRESENT   Pregnancy, urine POC     Status: Abnormal   Collection Time: 02/02/15  5:41 PM  Result Value Ref Range   Preg Test, Ur POSITIVE (A) NEGATIVE    Comment:        THE SENSITIVITY OF THIS METHODOLOGY IS >24 mIU/mL   Wet prep, genital     Status: Abnormal   Collection Time: 02/02/15  5:48 PM  Result Value Ref Range   Yeast Wet Prep HPF POC FEW (A) NONE SEEN   Trich, Wet Prep NONE SEEN NONE SEEN   Clue Cells Wet Prep HPF POC NONE SEEN NONE SEEN   WBC, Wet Prep HPF POC MODERATE (A) NONE SEEN    Comment: MANY BACTERIA SEEN    Review of Systems  Gastrointestinal: Positive for abdominal pain (Lower abdominal cramping ).  Genitourinary: Negative for dysuria, urgency, frequency, hematuria and flank pain.   Physical Exam   Blood pressure 119/73, pulse 106, temperature 98.4 F (36.9 C), temperature source Oral, resp. rate 16, height 5\' 7"  (1.702 m), weight  54.942 kg (121 lb 2 oz), last menstrual period 01/28/2015, not currently breastfeeding.  Physical Exam  Genitourinary:  Speculum exam: Vagina - Small amount of thick, creamy, discharge, no odor Cervix - No contact bleeding Bimanual exam: Cervix closed Uterus non tender,gravid  Adnexa non tender, no masses bilaterally GC/Chlam, wet prep done Chaperone present for exam.    MAU Course  Procedures  None  MDM  + fetal heart tones by fetal doppler.  Wet prep GC HIV   B positive blood type  Assessment and Plan   A:  1. Yeast vaginitis   2. Pregnancy test positive for incidental pregnancy     P:  Discharge home in stable condition RX: Terazol Start prenatal care  Return to MAU if symptoms worsen Pregnancy is unplanned; unsure of plan for keeping the pregnancy, support given.    Lezlie Lye, NP 02/02/2015 5:42 PM

## 2015-02-02 NOTE — MAU Note (Signed)
Pt states here for possible pregnancy as well as vaginal itching (x3 days). Denies discharge.

## 2015-02-03 LAB — HIV ANTIBODY (ROUTINE TESTING W REFLEX): HIV Screen 4th Generation wRfx: NONREACTIVE

## 2015-02-04 LAB — CULTURE, OB URINE
Culture: >=100000
Special Requests: NORMAL

## 2015-02-05 LAB — GC/CHLAMYDIA PROBE AMP (~~LOC~~) NOT AT ARMC
Chlamydia: NEGATIVE
Neisseria Gonorrhea: NEGATIVE

## 2015-03-15 ENCOUNTER — Encounter (HOSPITAL_COMMUNITY): Payer: Self-pay | Admitting: *Deleted

## 2015-03-15 ENCOUNTER — Inpatient Hospital Stay (HOSPITAL_COMMUNITY)
Admission: AD | Admit: 2015-03-15 | Discharge: 2015-03-16 | Disposition: A | Discharge status: Home / Self Care | Payer: Medicaid Other | Source: Ambulatory Visit | Attending: Obstetrics & Gynecology | Admitting: Obstetrics & Gynecology

## 2015-03-15 DIAGNOSIS — O9989 Other specified diseases and conditions complicating pregnancy, childbirth and the puerperium: Secondary | ICD-10-CM | POA: Insufficient documentation

## 2015-03-15 DIAGNOSIS — R109 Unspecified abdominal pain: Secondary | ICD-10-CM | POA: Insufficient documentation

## 2015-03-15 DIAGNOSIS — R102 Pelvic and perineal pain: Secondary | ICD-10-CM | POA: Insufficient documentation

## 2015-03-15 DIAGNOSIS — Z3687 Encounter for antenatal screening for uncertain dates: Secondary | ICD-10-CM

## 2015-03-15 DIAGNOSIS — Z3A17 17 weeks gestation of pregnancy: Secondary | ICD-10-CM | POA: Insufficient documentation

## 2015-03-15 DIAGNOSIS — O26899 Other specified pregnancy related conditions, unspecified trimester: Secondary | ICD-10-CM

## 2015-03-15 DIAGNOSIS — O26852 Spotting complicating pregnancy, second trimester: Secondary | ICD-10-CM

## 2015-03-15 DIAGNOSIS — Z3A15 15 weeks gestation of pregnancy: Secondary | ICD-10-CM

## 2015-03-15 NOTE — MAU Provider Note (Signed)
History     CSN: [id number]  Arrival date and time: 03/15/15 2253   First Provider Initiated Contact with Patient 03/15/15 2357      No chief complaint on file.  Pelvic Pain The patient's primary symptoms include pelvic pain. This is a new problem. Episode onset: x 5 days  The problem occurs constantly. The problem has been unchanged. Pain severity now: 6/10. The problem affects both sides. She is pregnant. Associated symptoms include abdominal pain and diarrhea (3 x per day of watery stool ). Pertinent negatives include no constipation, dysuria, fever, frequency, nausea, urgency or vomiting. The vaginal bleeding is spotting (x 2 days). The symptoms are aggravated by heavy lifting. She has tried nothing for the symptoms. She is sexually active. No, her partner does not have an STD.    Past Medical History  Diagnosis Date  . Asthma     managed with rescue inhaler only   . Infection     UTI  . Fibroid   . Hypertension     not currently on meds  . Eczema     Past Surgical History  Procedure Laterality Date  . Cesarean section    . Therapeutic abortion      Family History  Problem Relation Age of Onset  . Adopted: Yes    Social History  Substance Use Topics  . Smoking status: Never Smoker   . Smokeless tobacco: Never Used  . Alcohol Use: No     Comment: occ    Allergies: No Known Allergies  Prescriptions prior to admission  Medication Sig Dispense Refill Last Dose  . acetaminophen (TYLENOL) 325 MG tablet Take 650 mg by mouth every 6 (six) hours as needed for mild pain.   Not Taking at Unknown time  . albuterol (PROVENTIL HFA;VENTOLIN HFA) 108 (90 BASE) MCG/ACT inhaler Inhale 1-2 puffs into the lungs every 6 (six) hours as needed for wheezing or shortness of breath.   More than a month at Unknown time  . ibuprofen (ADVIL,MOTRIN) 200 MG tablet Take 200 mg by mouth every 6 (six) hours as needed for moderate pain.   Not Taking at Unknown time  . Prenatal Vit-Fe  Fumarate-FA (PRENATAL MULTIVITAMIN) TABS tablet Take 1 tablet by mouth daily.   Past Month at Unknown time  . terconazole (TERAZOL 7) 0.4 % vaginal cream Place 1 applicator vaginally at bedtime. 45 g 0     Review of Systems  Constitutional: Negative for fever.  Gastrointestinal: Positive for abdominal pain and diarrhea (3 x per day of watery stool ). Negative for nausea, vomiting and constipation.  Genitourinary: Positive for pelvic pain. Negative for dysuria, urgency and frequency.   Physical Exam   Blood pressure 112/74, pulse 74, temperature 98.4 F (36.9 C), temperature source Oral, resp. rate 20, height 5\' 6"  (1.676 m), weight 59.024 kg (130 lb 2 oz), last menstrual period 11/26/2014, not currently breastfeeding.  Physical Exam  Nursing note and vitals reviewed. Constitutional: She is oriented to person, place, and time. She appears well-developed and well-nourished. No distress.  HENT:  Head: Normocephalic.  Cardiovascular: Normal rate.   Respiratory: Effort normal.  GI: Soft. There is no tenderness. There is no rebound.  Genitourinary:  Fundus about midway between umbilicus and symphysis FHT 168 with doppler   Neurological: She is alert and oriented to person, place, and time.  Skin: Skin is warm and dry.  Psychiatric: She has a normal mood and affect.    [place]: 17 weeks 2 days, [facility] 08/21/14  No previa   MAU Course  Procedures  MDM   Assessment and Plan   1. [redacted] weeks gestation of pregnancy   2. Unsure of LMP (last menstrual period) as reason for ultrasound scan   3. [redacted] weeks gestation of pregnancy   4. Spotting during pregnancy in second trimester   5. Cramping affecting pregnancy, antepartum    DC home Comfort measures reviewed  2nd Trimester precautions  Bleeding precautions PTL precautions  Fetal kick counts RX: none  Return to MAU as needed FU with OB as planned  Follow-up Information    Follow up with [facility] [place].   Contact  information:   [place] [phone]        Mathis Bud 03/15/2015, 11:58 PM

## 2015-03-15 NOTE — MAU Note (Signed)
PT SAYS  SHE WAS HERE    IN April   .  THEN  SHE CAME  BACK  JuNE-   POSITIVE  PREG TEST.        CRAMPING   STARTED   ON  8-13.    VAG  SPOTTING    IN UNDERWEAR-  STARTED  ON 8-16.  LAST SEX-      MAY .  NO PNC.

## 2015-03-16 ENCOUNTER — Encounter (HOSPITAL_COMMUNITY): Payer: Self-pay | Admitting: *Deleted

## 2015-03-16 ENCOUNTER — Inpatient Hospital Stay (HOSPITAL_COMMUNITY): Payer: Medicaid Other

## 2015-03-16 DIAGNOSIS — R109 Unspecified abdominal pain: Secondary | ICD-10-CM

## 2015-03-16 DIAGNOSIS — O26852 Spotting complicating pregnancy, second trimester: Secondary | ICD-10-CM | POA: Diagnosis not present

## 2015-03-16 DIAGNOSIS — O9989 Other specified diseases and conditions complicating pregnancy, childbirth and the puerperium: Secondary | ICD-10-CM | POA: Diagnosis not present

## 2015-03-16 DIAGNOSIS — Z3A17 17 weeks gestation of pregnancy: Secondary | ICD-10-CM | POA: Diagnosis not present

## 2015-03-16 DIAGNOSIS — R102 Pelvic and perineal pain: Secondary | ICD-10-CM | POA: Diagnosis present

## 2015-03-16 NOTE — Discharge Instructions (Signed)
°  Prenatal Care Providers [facility]  [phone]     Phone: [place]                      [facility]  @[email or web address]     Phone: [phone]  Phone: [place]     Phone: [phone]  Phone: [facility] [place]                hone: [phone]  Phone: [phone]         [facility] [address]      Phone: [phone]  Phone: [phone]         [place] Dept.                Phone: [phone]  [place])          Phone: [phone] [facility]   Phone: [phone]

## 2015-04-23 ENCOUNTER — Emergency Department (HOSPITAL_COMMUNITY)
Admission: EM | Admit: 2015-04-23 | Discharge: 2015-04-24 | Disposition: A | Discharge status: Home / Self Care | Payer: Medicaid Other | Attending: Emergency Medicine | Admitting: Emergency Medicine

## 2015-04-23 ENCOUNTER — Encounter (HOSPITAL_COMMUNITY): Payer: Self-pay | Admitting: Cardiology

## 2015-04-23 DIAGNOSIS — Z79899 Other long term (current) drug therapy: Secondary | ICD-10-CM | POA: Diagnosis not present

## 2015-04-23 DIAGNOSIS — Z3490 Encounter for supervision of normal pregnancy, unspecified, unspecified trimester: Secondary | ICD-10-CM

## 2015-04-23 DIAGNOSIS — O99512 Diseases of the respiratory system complicating pregnancy, second trimester: Secondary | ICD-10-CM | POA: Insufficient documentation

## 2015-04-23 DIAGNOSIS — J45909 Unspecified asthma, uncomplicated: Secondary | ICD-10-CM | POA: Insufficient documentation

## 2015-04-23 DIAGNOSIS — O210 Mild hyperemesis gravidarum: Secondary | ICD-10-CM

## 2015-04-23 DIAGNOSIS — Z872 Personal history of diseases of the skin and subcutaneous tissue: Secondary | ICD-10-CM | POA: Insufficient documentation

## 2015-04-23 DIAGNOSIS — Z3A22 22 weeks gestation of pregnancy: Secondary | ICD-10-CM | POA: Diagnosis not present

## 2015-04-23 DIAGNOSIS — O10012 Pre-existing essential hypertension complicating pregnancy, second trimester: Secondary | ICD-10-CM | POA: Insufficient documentation

## 2015-04-23 DIAGNOSIS — Z87440 Personal history of urinary (tract) infections: Secondary | ICD-10-CM | POA: Diagnosis not present

## 2015-04-23 LAB — COMPREHENSIVE METABOLIC PANEL
ALT: 13 U/L — ABNORMAL LOW (ref 14–54)
ANION GAP: 5 (ref 5–15)
AST: 22 U/L (ref 15–41)
Albumin: 3 g/dL — ABNORMAL LOW (ref 3.5–5.0)
Alkaline Phosphatase: 76 U/L (ref 38–126)
BUN: <5 mg/dL — ABNORMAL LOW (ref 6–20)
CO2: 24 mmol/L (ref 22–32)
Calcium: 8.5 mg/dL — ABNORMAL LOW (ref 8.9–10.3)
Chloride: 108 mmol/L (ref 101–111)
Creatinine, Ser: 0.64 mg/dL (ref 0.44–1.00)
GFR calc Af Amer: >60 mL/min (ref >60)
Glucose, Bld: 95 mg/dL (ref 65–99)
Potassium: 3.3 mmol/L — ABNORMAL LOW (ref 3.5–5.1)
SODIUM: 137 mmol/L (ref 135–145)
TOTAL PROTEIN: 6.7 g/dL (ref 6.5–8.1)
Total Bilirubin: 0.5 mg/dL (ref 0.3–1.2)

## 2015-04-23 LAB — CBC
HCT: 33.3 % — ABNORMAL LOW (ref 36.0–46.0)
HEMOGLOBIN: 11.7 g/dL — AB (ref 12.0–15.0)
MCH: 32 pg (ref 26.0–34.0)
MCHC: 35.1 g/dL (ref 30.0–36.0)
MCV: 91 fL (ref 78.0–100.0)
Platelets: 200 10*3/uL (ref 150–400)
RBC: 3.66 MIL/uL — AB (ref 3.87–5.11)
RDW: 13.1 % (ref 11.5–15.5)
WBC: 8.3 10*3/uL (ref 4.0–10.5)

## 2015-04-23 MED ORDER — ONDANSETRON HCL 4 MG/2ML IJ SOLN
4.0000 mg | Freq: Once | INTRAMUSCULAR | Status: AC
  Administered 2015-04-23: 4 mg via INTRAVENOUS
  Filled 2015-04-23: qty 2

## 2015-04-23 MED ORDER — SODIUM CHLORIDE 0.9 % IV BOLUS (SEPSIS)
1000.0000 mL | Freq: Once | INTRAVENOUS | Status: AC
  Administered 2015-04-23: 1000 mL via INTRAVENOUS

## 2015-04-23 MED ORDER — ONDANSETRON 8 MG PO TBDP: 8.0000 mg | ORAL_TABLET | Freq: Three times a day (TID) | ORAL | Status: DC | PRN

## 2015-04-23 NOTE — Discharge Instructions (Signed)
Medication for nausea. Follow-up your obstetrician.

## 2015-04-23 NOTE — ED Provider Notes (Addendum)
CSN: [id number]     Arrival date & time 04/23/15  1749 History   First MD Initiated Contact with Patient 04/23/15 1934     Chief Complaint  Patient presents with  . Abdominal Pain  . Emesis     (Consider location/radiation/quality/duration/timing/severity/associated sxs/prior Treatment) HPI...Marland KitchenMarland [id number] now [redacted] weeks pregnant presents with nausea, vomiting, diarrhea for 24 hours. No vaginal bleeding or discharge. She sees Dr. Ruthann Cancer for her obstetrician.   No fever, sweats, chills, dysuria.  Severity of symptoms is mild to moderate.  Past Medical History  Diagnosis Date  . Asthma     managed with rescue inhaler only   . Infection     UTI  . Fibroid   . Hypertension     not currently on meds  . Eczema    Past Surgical History  Procedure Laterality Date  . Cesarean section    . Therapeutic abortion     Family History  Problem Relation Age of Onset  . Adopted: Yes   Social History  Substance Use Topics  . Smoking status: Never Smoker   . Smokeless tobacco: Never Used  . Alcohol Use: No     Comment: occ   OB History    Gravida Para Term Preterm AB TAB SAB Ectopic Multiple Living   5 2 2  2 1 1   0 2     Review of Systems  All other systems reviewed and are negative.     Allergies  Review of patient's allergies indicates no known allergies.  Home Medications   Prior to Admission medications   Medication Sig Start Date End Date Taking? Authorizing Provider  Prenatal Vit-Fe Fumarate-FA (PRENATAL MULTIVITAMIN) TABS tablet Take 1 tablet by mouth daily.   Yes Historical Provider, MD  ondansetron (ZOFRAN ODT) 8 MG disintegrating tablet Take 1 tablet (8 mg total) by mouth every 8 (eight) hours as needed for nausea or vomiting. 04/23/15   Nat Christen, MD  terconazole (TERAZOL 7) 0.4 % vaginal cream Place 1 applicator vaginally at bedtime. 02/02/15   Artist Pais Rasch, NP   BP 103/62 mmHg  Pulse 82  Temp(Src) 98.1 F (36.7 C) (Oral)  Resp 18  SpO2 98%  LMP  11/26/2014 Physical Exam  Constitutional: She is oriented to person, place, and time.  Patient appears slightly dehydrated, but nontoxic-appearing.  HENT:  Head: Normocephalic and atraumatic.  Eyes: Conjunctivae and EOM are normal. Pupils are equal, round, and reactive to light.  Neck: Normal range of motion. Neck supple.  Cardiovascular: Normal rate and regular rhythm.   Pulmonary/Chest: Effort normal and breath sounds normal.  Abdominal: Soft. Bowel sounds are normal.  Gravid abdomen.  Musculoskeletal: Normal range of motion.  Neurological: She is alert and oriented to person, place, and time.  Skin: Skin is warm and dry.  Psychiatric: She has a normal mood and affect. Her behavior is normal.  Nursing note and vitals reviewed.   ED Course  Procedures (including critical care time) Labs Review Labs Reviewed  COMPREHENSIVE METABOLIC PANEL - Abnormal; Notable for the following:    Potassium 3.3 (*)    BUN <5 (*)    Calcium 8.5 (*)    Albumin 3.0 (*)    ALT 13 (*)    All other components within normal limits  CBC - Abnormal; Notable for the following:    RBC 3.66 (*)    Hemoglobin 11.7 (*)    HCT 33.3 (*)    All other components within normal limits  URINALYSIS, ROUTINE W  REFLEX MICROSCOPIC (NOT AT [facility])    Imaging Review No results found. I have personally reviewed and evaluated these images and lab results as part of my medical decision-making.   EKG Interpretation None      MDM   Final diagnoses:  Pregnancy  Hyperemesis gravidarum    Patient feels better after 2 L of IV fluids and IV Zofran. Discharge medications Zofran 8 mg ODT.    Nat Christen, MD 04/23/15 Trainer, MD 04/28/15 2212

## 2015-04-23 NOTE — ED Notes (Addendum)
Pt reports abd pain, vomiting and diarrhea that started last night. Reports she is [redacted] weeks pregnant and has had prenatal care so far. Reports normal movement of the baby.

## 2015-04-23 NOTE — ED Notes (Addendum)
Requested urine from pt, pt states she just urinated and unable to go.

## 2015-04-26 ENCOUNTER — Telehealth: Payer: Self-pay | Admitting: Surgery

## 2015-04-26 ENCOUNTER — Encounter (HOSPITAL_COMMUNITY): Payer: Self-pay | Admitting: *Deleted

## 2015-04-26 ENCOUNTER — Inpatient Hospital Stay (HOSPITAL_COMMUNITY): Payer: Medicaid Other

## 2015-04-26 ENCOUNTER — Inpatient Hospital Stay (HOSPITAL_COMMUNITY)
Admission: AD | Admit: 2015-04-26 | Discharge: 2015-05-14 | DRG: 766 | Disposition: A | Discharge status: Home / Self Care | Payer: Medicaid Other | Source: Ambulatory Visit | Attending: Obstetrics | Admitting: Obstetrics

## 2015-04-26 DIAGNOSIS — Z98891 History of uterine scar from previous surgery: Secondary | ICD-10-CM

## 2015-04-26 DIAGNOSIS — O34211 Maternal care for low transverse scar from previous cesarean delivery: Secondary | ICD-10-CM | POA: Diagnosis present

## 2015-04-26 DIAGNOSIS — O321XX0 Maternal care for breech presentation, not applicable or unspecified: Secondary | ICD-10-CM | POA: Diagnosis present

## 2015-04-26 DIAGNOSIS — Z3A23 23 weeks gestation of pregnancy: Secondary | ICD-10-CM

## 2015-04-26 DIAGNOSIS — O42912 Preterm premature rupture of membranes, unspecified as to length of time between rupture and onset of labor, second trimester: Secondary | ICD-10-CM | POA: Diagnosis present

## 2015-04-26 DIAGNOSIS — O42919 Preterm premature rupture of membranes, unspecified as to length of time between rupture and onset of labor, unspecified trimester: Secondary | ICD-10-CM

## 2015-04-26 DIAGNOSIS — I10 Essential (primary) hypertension: Secondary | ICD-10-CM

## 2015-04-26 DIAGNOSIS — Z3A25 25 weeks gestation of pregnancy: Secondary | ICD-10-CM | POA: Diagnosis not present

## 2015-04-26 DIAGNOSIS — O34219 Maternal care for unspecified type scar from previous cesarean delivery: Secondary | ICD-10-CM

## 2015-04-26 LAB — CBC
HCT: 32.2 % — ABNORMAL LOW (ref 36.0–46.0)
Hemoglobin: 11.1 g/dL — ABNORMAL LOW (ref 12.0–15.0)
MCH: 31.6 pg (ref 26.0–34.0)
MCHC: 34.5 g/dL (ref 30.0–36.0)
MCV: 91.7 fL (ref 78.0–100.0)
PLATELETS: 199 10*3/uL (ref 150–400)
RBC: 3.51 MIL/uL — ABNORMAL LOW (ref 3.87–5.11)
RDW: 13.3 % (ref 11.5–15.5)
WBC: 11.1 10*3/uL — ABNORMAL HIGH (ref 4.0–10.5)

## 2015-04-26 LAB — TYPE AND SCREEN
ABO/RH(D): B POS
ANTIBODY SCREEN: NEGATIVE

## 2015-04-26 MED ORDER — ERYTHROMYCIN BASE 250 MG PO TABS
250.0000 mg | ORAL_TABLET | Freq: Four times a day (QID) | ORAL | Status: DC
Start: 2015-04-29 — End: 2015-04-27

## 2015-04-26 MED ORDER — AMOXICILLIN 500 MG PO CAPS: 500.0000 mg | ORAL_CAPSULE | Freq: Three times a day (TID) | ORAL | Status: DC

## 2015-04-26 MED ORDER — ZOLPIDEM TARTRATE 5 MG PO TABS: 5.0000 mg | ORAL_TABLET | Freq: Every evening | ORAL | Status: DC | PRN

## 2015-04-26 MED ORDER — ACETAMINOPHEN 325 MG PO TABS: 650.0000 mg | ORAL_TABLET | ORAL | Status: DC | PRN

## 2015-04-26 MED ORDER — DOCUSATE SODIUM 100 MG PO CAPS
100.0000 mg | ORAL_CAPSULE | Freq: Every day | ORAL | Status: DC
  Administered 2015-04-27: 100 mg via ORAL
  Filled 2015-04-26: qty 1

## 2015-04-26 MED ORDER — SODIUM CHLORIDE 0.9 % IV SOLN
2.0000 g | Freq: Four times a day (QID) | INTRAVENOUS | Status: DC
  Administered 2015-04-26 – 2015-04-27 (×4): 2 g via INTRAVENOUS
  Filled 2015-04-26 (×6): qty 2000

## 2015-04-26 MED ORDER — MAGNESIUM SULFATE 50 % IJ SOLN
2.0000 g/h | INTRAVENOUS | Status: DC
  Administered 2015-04-27: 2 g/h via INTRAVENOUS
  Filled 2015-04-26 (×2): qty 80

## 2015-04-26 MED ORDER — SODIUM CHLORIDE 0.9 % IV SOLN
500.0000 mg | Freq: Once | INTRAVENOUS | Status: AC
  Administered 2015-04-26: 500 mg via INTRAVENOUS
  Filled 2015-04-26: qty 10

## 2015-04-26 MED ORDER — SODIUM CHLORIDE 0.9 % IV SOLN
250.0000 mg | Freq: Four times a day (QID) | INTRAVENOUS | Status: DC
  Administered 2015-04-27 (×2): 250 mg via INTRAVENOUS
  Filled 2015-04-26 (×5): qty 5

## 2015-04-26 MED ORDER — PRENATAL MULTIVITAMIN CH
1.0000 | ORAL_TABLET | Freq: Every day | ORAL | Status: DC
  Administered 2015-04-27: 1 via ORAL
  Filled 2015-04-26: qty 1

## 2015-04-26 MED ORDER — LACTATED RINGERS IV SOLN
INTRAVENOUS | Status: DC
  Administered 2015-04-26 – 2015-04-27 (×2): via INTRAVENOUS

## 2015-04-26 MED ORDER — CALCIUM CARBONATE ANTACID 500 MG PO CHEW: 2.0000 | CHEWABLE_TABLET | ORAL | Status: DC | PRN

## 2015-04-26 MED ORDER — MAGNESIUM SULFATE BOLUS VIA INFUSION
4.0000 g | Freq: Once | INTRAVENOUS | Status: AC
  Administered 2015-04-26: 4 g via INTRAVENOUS
  Filled 2015-04-26: qty 500

## 2015-04-26 MED ORDER — BETAMETHASONE SOD PHOS & ACET 6 (3-3) MG/ML IJ SUSP
12.0000 mg | INTRAMUSCULAR | Status: DC
  Administered 2015-04-26: 12 mg via INTRAMUSCULAR
  Filled 2015-04-26 (×2): qty 2

## 2015-04-26 NOTE — Telephone Encounter (Signed)
ED CM received call from patient with concerns regarding care on 04/23/15 at [facility] ED.  Contact information taken and given to Latrelle Dodrill ED AD to follow up with patient.

## 2015-04-27 MED ORDER — SODIUM CHLORIDE 0.9 % IV SOLN
250.0000 mg | Freq: Four times a day (QID) | INTRAVENOUS | Status: DC
  Filled 2015-04-27 (×2): qty 5

## 2015-04-27 MED ORDER — ZOLPIDEM TARTRATE 5 MG PO TABS
5.0000 mg | ORAL_TABLET | Freq: Every evening | ORAL | Status: DC | PRN
  Administered 2015-04-30: 5 mg via ORAL
  Filled 2015-04-27: qty 1

## 2015-04-27 MED ORDER — BETAMETHASONE SOD PHOS & ACET 6 (3-3) MG/ML IJ SUSP
12.0000 mg | INTRAMUSCULAR | Status: AC
  Administered 2015-04-27: 12 mg via INTRAMUSCULAR
  Filled 2015-04-27: qty 2

## 2015-04-27 MED ORDER — LACTATED RINGERS IV SOLN
INTRAVENOUS | Status: DC
  Administered 2015-04-27 – 2015-04-28 (×2): via INTRAVENOUS

## 2015-04-27 MED ORDER — MAGNESIUM SULFATE 50 % IJ SOLN: 2.0000 g/h | INTRAVENOUS | Status: DC

## 2015-04-27 MED ORDER — AMOXICILLIN 500 MG PO CAPS: 500.0000 mg | ORAL_CAPSULE | Freq: Three times a day (TID) | ORAL | Status: DC

## 2015-04-27 MED ORDER — ACETAMINOPHEN 325 MG PO TABS
650.0000 mg | ORAL_TABLET | ORAL | Status: DC | PRN
  Administered 2015-04-28 – 2015-05-11 (×2): 650 mg via ORAL
  Filled 2015-04-27 (×2): qty 2

## 2015-04-27 MED ORDER — ERYTHROMYCIN BASE 250 MG PO TABS
250.0000 mg | ORAL_TABLET | Freq: Four times a day (QID) | ORAL | Status: AC
  Administered 2015-04-29 – 2015-05-03 (×20): 250 mg via ORAL
  Filled 2015-04-27 (×21): qty 1

## 2015-04-27 MED ORDER — PRENATAL MULTIVITAMIN CH
1.0000 | ORAL_TABLET | Freq: Every day | ORAL | Status: DC
  Administered 2015-04-28 – 2015-05-10 (×13): 1 via ORAL
  Filled 2015-04-27 (×13): qty 1

## 2015-04-27 MED ORDER — SODIUM CHLORIDE 0.9 % IV SOLN
2.0000 g | Freq: Four times a day (QID) | INTRAVENOUS | Status: DC
  Filled 2015-04-27 (×3): qty 2000

## 2015-04-27 MED ORDER — SODIUM CHLORIDE 0.9 % IV SOLN
2.0000 g | Freq: Four times a day (QID) | INTRAVENOUS | Status: AC
  Administered 2015-04-27 – 2015-04-28 (×4): 2 g via INTRAVENOUS
  Filled 2015-04-27 (×4): qty 2000

## 2015-04-27 MED ORDER — AMOXICILLIN 500 MG PO CAPS
500.0000 mg | ORAL_CAPSULE | Freq: Three times a day (TID) | ORAL | Status: AC
  Administered 2015-04-28 – 2015-05-03 (×15): 500 mg via ORAL
  Filled 2015-04-27 (×15): qty 1

## 2015-04-27 MED ORDER — CALCIUM CARBONATE ANTACID 500 MG PO CHEW: 2.0000 | CHEWABLE_TABLET | ORAL | Status: DC | PRN

## 2015-04-27 MED ORDER — SODIUM CHLORIDE 0.9 % IV SOLN
250.0000 mg | Freq: Four times a day (QID) | INTRAVENOUS | Status: AC
  Administered 2015-04-27 – 2015-04-29 (×6): 250 mg via INTRAVENOUS
  Filled 2015-04-27 (×6): qty 5

## 2015-04-27 MED ORDER — DOCUSATE SODIUM 100 MG PO CAPS
100.0000 mg | ORAL_CAPSULE | Freq: Every day | ORAL | Status: DC
Start: 2015-04-28 — End: 2015-05-11
  Administered 2015-04-28 – 2015-05-10 (×13): 100 mg via ORAL
  Filled 2015-04-27 (×13): qty 1

## 2015-04-27 MED ORDER — ERYTHROMYCIN BASE 250 MG PO TABS: 250.0000 mg | ORAL_TABLET | Freq: Four times a day (QID) | ORAL | Status: DC

## 2015-04-27 NOTE — Progress Notes (Signed)
Chaplain was referred by LSW and weekday chaplain to visit Ms Geil concerning the stress she is feeling relative to the care of her children and legal actions against her for custody of these children. Ms Boston related that because of her hospital stay she will miss court and mediator appointments. The Mediator has stated that it will take a fax from the attending physician stating that Ms Bake is in the hospital and for what reason,. According to Ms Komorowski both she and her physician are resistant, citing privacy and HIPPA concerns. This has been very stressful for Ms Buzzell and with her relationship with the FOB of her unborn child and of some or all of her other children the stress has grown. She does not wish to do anything to harm her unborn child nor does she wish for mediators and judges to unfairly take her children because of her hospitalization.   Chaplain spoke to Ms Hughley about feeling safe, being safe and assuring safety for herself. Conversation about keeping stress at the lowest possible levels and requesting assistance when she feels her stress levels grow out of control.  Throughout the conversation Ms Kinlaw was rational, calm and focused. She voiced appreciation for the staff for their great care, and for the LSW for her professional concern and assistance. Ms Sherley relates she has some information to pass on to the LSW and plans to do so as soon as she can acquire the information,   Please page the chaplain should Ms Fujita wish or need further spiritual care. Chaplain will re-visit Ms Cuff, if possible, during the weekend rounding.  Sallee Lange. [place]kin, DMin, MDiv Chaplain

## 2015-04-27 NOTE — Plan of Care (Signed)
Problem: Phase I Progression Outcomes Goal: Spiritual Needs (Notify SW/CM or Social Work) Outcome: Progressing Social work and Clinical biochemist following patient
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
## 2015-04-27 NOTE — H&P (Signed)
This is Dr. Gracy Racer dictating the history and physical on  Caitlin Greer  she's a 29 year old gravida 4 para 2012 at 23 weeks and 2 days her EDC is 08/22/2015 the patient was seen in the office yesterday and she states that she had been leaking fluid for 4 days asked her speculum exam revealed amniotic fluid in the vagina the membranes were visible through the cervix which appeared to be about 3 cm dilated a digital exam was not done in the fetal heart was present the patient was admitted to the hospital because of premature ruptured membranes and dilated cervix and started on betamethasone 12 mg IM 20 24 hours she was started on ampicillin 2 g IV every 6 hours and erythromycin IV she is also on magnesium sulfate 4 g loading 2 g an hour and she was not having any contractions Past medical history she has a history of herpes with rare outbreaks Past surgical history she has a history of 1 cesarean section and one vaginal delivery Social history denies smoking drinking and alcohol use Family history negative System review noncontributory Physical exam well-developed female 23 weeks with ruptured membranes HEENT negative Lungs clear to P&A Heart regular rhythm no murmurs no gallops Breasts negative Abdomen uterus 22 weeks size Pelvic as described above Extremities negative

## 2015-04-27 NOTE — Progress Notes (Signed)
Patient ID: Caitlin Greer, female   DOB: 04/03/1986, 29 y.o.   MRN: [id number] Blood pressure 91/52 respiration 20 pulse 74 afebrile Patient states that she's not leaking fluid this morning she's not contracting and she is on her and biotics and got 1 dose of betamethasone already she had an ultrasound last night with the results are not available at this time

## 2015-04-27 NOTE — Progress Notes (Signed)
Patient's OB Case Manager is Leda Roys.

## 2015-04-28 NOTE — Progress Notes (Signed)
Patient ID: Caitlin Greer, female   DOB: Dec 06, 1985, 28 y.o.   MRN: [id number] Blood pressure 110/56 afebrile pulse 60 respiration 18 Small amount of leakage no contractions Her magnesium was discontinued yesterday after 12 hours Continue present therapy

## 2015-04-29 MED ORDER — SODIUM CHLORIDE 0.9 % IJ SOLN
3.0000 mL | Freq: Two times a day (BID) | INTRAMUSCULAR | Status: DC
  Administered 2015-04-29 – 2015-05-05 (×14): 3 mL via INTRAVENOUS

## 2015-04-29 NOTE — Consult Note (Signed)
[facility]  Prenatal Consult       04/29/2015  12:04 PM   I was asked by Dr. Ruthann Cancer to consult on this patient for possible preterm delivery.  I had the pleasure of meeting with Caitlin Greer today.  She is a 29 y/o G5P2022 at 74 and 4/[redacted] weeks gestation who was admitted to the antepartum unit on 9/30 after spontaneous ROM on 9/26.  She continues to leak a small amount of fluid and is dilated to 3 cm, though there has been no cervical change since admission and she is not have regular contractions.  She has a history of HSV with rare outbreaks and history of a prior c-section.  She received BMZ on 9/29 and 9/30 and is being treated with antibotics.  She has a 31 y/o and a 14 month old at home.  This fetus is a female.   I explained that we do not routinely offer resuscitation to infants less than [redacted] weeks gestation because the chance of survival is so low.  I also explained that we would universally recommend resuscitating a typical infant >[redacted] weeks gestation because the chance of survival with a good outcome is so high.    I explained that the neonatal intensive care team would be present for the delivery and outlined the likely delivery room course for this baby including routine resuscitation and NRP-guided approaches to the treatment of respiratory distress. We discussed other common problems associated with prematurity including respiratory distress syndrome/CLD, apnea, feeding issues, temperature regulation, and infection risk.  We briefly discussed IVH/PVL, ROP, and NEC and that these are complications associated with prematurity, but that by 30 weeks are uncommon.    We discussed the average length of stay but I noted that the actual LOS would depend on the severity of problems encountered and response to treatments.  We discussed visitation policies and the resources available while her child is in the hospital.  We discussed the importance of good nutrition and various  methods of providing nutrition (parenteral hyperalimentation, gavage feedings and/or oral feeding). We discussed the benefits of human milk. I encouraged breast feeding and pumping soon after birth and outlined resources that are available to support breast feeding.   Finally, given all this information, we discussed possible approaches that parents choose in cases where survival is in question: 1) comfort care 2) initial care, if medically possible, to assess response to therapies, or 3) provision of aggressive, high technology care until such care is deemed futile.  At this time, should her infant be born extremely prematurely, Caitlin Greer wishes to initiate intensive case unless it is clear that the response to treatment is not promising or is accompanied by significant complications.  I emphasized the importance of clear, honest, and open communication and indicated that we would involve her in decision making.   Thank you for involving [place] in the care of this patient. A member of our team will be available should the family have additional questions.  Time for consultation approximately 45 minutes.   _____________________ Electronically Signed By: Clinton Gallant, MD Neonatologist

## 2015-04-29 NOTE — Progress Notes (Signed)
Patient ID: Caitlin Greer, female   DOB: [birth year], 29 y.o.   MRN: [id number] Blood pressure 90/41 respiration 18 pulse 51 Not contracting Still a small amount leakage Continue present therapy

## 2015-04-30 LAB — TYPE AND SCREEN
ABO/RH(D): B POS
Antibody Screen: NEGATIVE

## 2015-04-30 NOTE — Progress Notes (Signed)
Patient ID: Caitlin Greer, female   DOB: 20-Feb-1986, 28 y.o.   MRN: [id number] Blood pressure 1020 55 respiration 18 pulse 69 afebrile Patient had a discussion with NICU yesterday both Pratt doses she is no 23 weeks and 6 days and she had 2 contractions over the past 24 hours that the patient did not feel and she still willing a small amount of leakage she is now on  Po antibiotics  and has no complaints today

## 2015-04-30 NOTE — Progress Notes (Signed)
CSW followed up with patient to offer ongoing support and complete assessment that was interrupted by visitors on 04/27/15.  Patient was alert and welcoming of CSW's visit.  She states she had a good weekend and informed CSW that she and FOB have been talking.  Patient feels positive about the communication and states he has informed her that he would like to get things "worked out" regarding the custody agreement.  Patient was much more focused on the negative aspects of their relationship on first meeting with her.  Today, she appears hopeful that they will be able to successfully co-parent their children.  She states when she informed FOB of her hospitalization, he cried and they "went straight to prayer."  Patient reports that she allowed FOB to have a visit with his daughter at patient's mother's home on Saturday and that "it did not go well."  CSW asked her to elaborate on this and she states her daughter cried and "did not want to go" to him.  She reports that he has not seen his daughter for 6 months because of "the drama between her and FOB."  She states she "feels bad" that her mother is having to care for her children while she is in the hospital because of their inability to co-parent.  Patient explained that she is not ready for FOB to visit her in the hospital, but wants to first see how the communication continues and how the visits go with their 41 month old.  Patient added that FOB wants to take her on a trip (after pregnancy) and that she has agreed to this.  CSW commends patient for setting boundaries with FOB as she evaluates whether his actions match his words.  CSW cautioned her regarding agreeing to go on a trip with FOB, as she does not need to make that kind of decision at this time.  CSW recommends counseling to the couple based on everything patient initially disclosed to [place].  Patient replied that she is very interested in going to counseling, but thinks FOB might "blow up" if she suggests  it.  CSW asked her to consider this statement and the effort she and FOB are willing to put forward to have a working relationship.  CSW recommends individual counseling for patient if they do not decide to enter counseling together.  Patient seems far less interested in individual counseling.  She asked if CSW would be willing to speak to FOB about counseling if he were to visit her at some point.  CSW agreed to have a conversation with her and FOB should she desire.   Patient became tearful as she began to think about the physical aspects of her pregnancy and hospitalization.  She states she does not know what caused this (CSW attempted to clarify "this," and presumes she is referring to PPROM and preterm labor.)  She asked CSW about GBS and CSW encouraged her to talk with her medical provider about this question.  CSW assisted patient in processing her feelings regarding her hospitalization.  Patient concluded that she needed to "slow down."  CSW spoke about the important role she has in carrying her child for as long as possible by remaining in bed in the hospital.  CSW encouraged her to utilize relaxation techniques to remain calm/de-stress.  She states a commitment to remaining in the hospital.  She states a desire for her daughters to stay overnight with her.  CSW asked her to think about people who she can ask  to stay here with them.   CSW offered counseling referrals if she desires this at any time (couple or individual).  CSW offers to return any time she feels like she would like to process her thoughts and feeling.  CSW offered to provide verification letter to the court/mediation as discussed on Friday, but she declines need for assistance with this at this time.  Overall, patient appears to be in good spirits and thanked CSW for the visit.

## 2015-04-30 NOTE — Progress Notes (Signed)
Pt refusing type and screen to be drawn at this time. Educated on importance of type and screen. Pt states she will "think about it" and let me know if she changes her mind.

## 2015-05-01 NOTE — Progress Notes (Signed)
Patient ID: Caitlin Greer, female   DOB: 1985/10/27, 29 y.o.   MRN: [id number] Blood pressure 96/50 respiration 18 pulse 67 afebrile No contractions No leakage in the past 24 hours Condition unchanged

## 2015-05-01 NOTE — Plan of Care (Signed)
Problem: Phase I Progression Outcomes Goal: Bowel movement every 2 days Outcome: Not Met (add Reason) Patient does not want to bare down to have BM.  She is scared

## 2015-05-02 NOTE — Progress Notes (Signed)
Initial Nutrition Assessment  DOCUMENTATION CODES:   Not applicable  INTERVENTION:  Regular diet, snacks TID Retail ordering option, double protein portion option   NUTRITION DIAGNOSIS:   Increased nutrient needs related to  (pregnancy and fetal growth requirements) as evidenced by  ([redacted] weeks pregnant).  GOAL:   Patient will meet greater than or equal to 90% of their needs, Weight gain  MONITOR:   Weight trends  REASON FOR ASSESSMENT:   Antenatal    ASSESSMENT:   24 weeks with PROM, PTL. Pt reports pre-pregnancy weight of 120 lbs, 15 lb weight gain. Pre-pregnancy BMI 19.4. Weight gain goals 25-35 lbs. Appetite good, no nausea  Diet Order:  Diet regular Room service appropriate?: Yes; Fluid consistency:: Thin  Skin:  Reviewed, no issues  Height:   Ht Readings from Last 1 Encounters:  04/26/15 5\' 6"  (1.676 m)    Weight:   Wt Readings from Last 1 Encounters:  05/02/15 135 lb 11.2 oz (61.553 kg)    Ideal Body Weight:  59 kg  BMI:  Body mass index is 21.91 kg/(m^2).  Estimated Nutritional Needs:   Kcal:  1700-1900  Protein:  72-82 g  Fluid:  2 L  EDUCATION NEEDS:   No education needs identified at this time  Cathlean Sauer.Fredderick Severance LDN Neonatal Nutrition Support Specialist/RD III Pager [phone]      Phone [phone]

## 2015-05-02 NOTE — Progress Notes (Signed)
Patient ID: Caitlin Greer, female   DOB: 1986-03-06, 29 y.o.   MRN: [id number] Blood pressure 93/43 respiration 16 pulse 76 afebrile Patient states she has not taken the fluid and more and 4 days she has not been having any contractions and day baby is reactive she's no 24 weeks and 2 days

## 2015-05-03 LAB — TYPE AND SCREEN
ABO/RH(D): B POS
Antibody Screen: NEGATIVE

## 2015-05-03 MED ORDER — HYDROCORTISONE 2.5 % RE CREA
TOPICAL_CREAM | Freq: Two times a day (BID) | RECTAL | Status: DC
  Administered 2015-05-03 – 2015-05-05 (×6): via RECTAL
  Filled 2015-05-03: qty 28.35

## 2015-05-03 MED ORDER — WITCH HAZEL-GLYCERIN EX PADS: MEDICATED_PAD | CUTANEOUS | Status: DC | PRN

## 2015-05-03 MED ORDER — KETOCONAZOLE 2 % EX CREA
TOPICAL_CREAM | Freq: Every day | CUTANEOUS | Status: DC
  Administered 2015-05-03 – 2015-05-10 (×8): via TOPICAL
  Filled 2015-05-03: qty 15

## 2015-05-03 MED ORDER — POLYETHYLENE GLYCOL 3350 17 G PO PACK
17.0000 g | PACK | Freq: Every day | ORAL | Status: DC
  Administered 2015-05-03 – 2015-05-10 (×8): 17 g via ORAL
  Filled 2015-05-03 (×8): qty 1

## 2015-05-03 NOTE — Progress Notes (Signed)
Rash on the exterior vaginal area to the rectal area. Pt c/o burning with voids and itching in the area as well.

## 2015-05-03 NOTE — Progress Notes (Signed)
Patient ID: Caitlin Greer, female   DOB: 07/30/1985, 29 y.o.   MRN: [id number] Blood pressure 112/61 respiration 16 pulse 83 afebrile No leakage of fluid No contractions She is on by mouth ampicillin and erythromycin status unchanged

## 2015-05-04 NOTE — Progress Notes (Signed)
Rn called to room and pt stating that she got a little "faint feeling" when she was up to the [facility]. VSS. Pt feeling better. Pt encouragesd to sit at the bedside for a brief moment before getting up. Pt also instructed to call for assistance if she feels bad. Pt velbalizes agreement.

## 2015-05-04 NOTE — Progress Notes (Signed)
CSW received call from patient stating she would like CSW to write the letters verifying her hospitalization and fax to agencies needing this documentation.  CSW wrote and faxed letters, and provided patient with fax confirmation sheets.  Patient appreciative.

## 2015-05-04 NOTE — Progress Notes (Signed)
Patient ID: Caitlin Greer, female   DOB: [birth year], 29 y.o.   MRN: [id number] Blood pressure 111 no a 58 pulse 75 afebrile respiration 18 Patient has a small amount of discharged on 12 swelling and she is not contracting status unchanged

## 2015-05-05 NOTE — Progress Notes (Signed)
Patient ID: Caitlin Greer, female   DOB: 1985/08/13, 29 y.o.   MRN: [id number] Hospital Day: 68  S: Preterm labor symptoms: no complaints  O: Blood pressure 105/67, pulse 91, temperature 98.5 F (36.9 C), temperature source Oral, resp. rate 18, height 5\' 6"  (1.676 m), weight 135 lb 11.2 oz (61.553 kg), last menstrual period 11/26/2014, SpO2 97 %, not currently breastfeeding.   [id number]: 150 bpm Toco: None SVE:   A/P- 29 y.o. admitted with: Rupture of membranes.  Stable on bedrest.  Continue bedrest.  Present on Admission:  . Preterm labor  Pregnancy Complications: PPROM  Preterm labor management: bedrest advised and pelvic rest advised Dating:  [redacted]w[redacted]d PNL Needed:  none FWB:  good PTL:  stable

## 2015-05-06 LAB — TYPE AND SCREEN
ABO/RH(D): B POS
Antibody Screen: NEGATIVE

## 2015-05-06 MED ORDER — HYDROCORTISONE ACETATE 25 MG RE SUPP
25.0000 mg | Freq: Two times a day (BID) | RECTAL | Status: DC
  Administered 2015-05-06 – 2015-05-10 (×10): 25 mg via RECTAL
  Filled 2015-05-06 (×13): qty 1

## 2015-05-06 MED ORDER — HYDROCORTISONE ACE-PRAMOXINE 1-1 % RE FOAM
1.0000 | Freq: Four times a day (QID) | RECTAL | Status: DC | PRN
  Administered 2015-05-07 – 2015-05-08 (×2): 1 via RECTAL
  Filled 2015-05-06: qty 10

## 2015-05-06 NOTE — Progress Notes (Signed)
Patient ID: Caitlin Greer, female   DOB: 1986-04-27, 29 y.o.   MRN: [id number] Hospital Day: 69  S: Preterm labor symptoms: no complaints  O: Blood pressure 106/72, pulse 93, temperature 98.7 F (37.1 C), temperature source Oral, resp. rate 20, height 5\' 6"  (1.676 m), weight 135 lb 11.2 oz (61.553 kg), last menstrual period 11/26/2014, SpO2 97 %, not currently breastfeeding.   UQJ:[id number]: 130 bpm Toco: None SVE:   A/P- 29 y.o. admitted with: Preterm cervical dilation.  Stable on bedrest.  Continue bedrest.  Present on Admission:  . Preterm labor  Pregnancy Complications: none  Preterm labor management: bedrest advised and pelvic rest advised Dating:  [redacted]w[redacted]d PNL Needed:  none FWB:  good PTL:  stable

## 2015-05-07 NOTE — Progress Notes (Signed)
Patient called out stating she was leaking again after not leaking for 5 days. It was explained to her what occurs when membranes rupture. Patient can in with PPROM. No contractions noted, pt not having any pain. Will continue to monitor.

## 2015-05-07 NOTE — Progress Notes (Signed)
Followed up with patient who states that she is doing better today.  She reports that since her admission to the hospital, her SO has been doing much better and she's hopeful about their relationship.  She says that her SO has spent the last couple of days with their daughter and that things are going well.  She is supposed to submit all of the paperwork to the courts tomorrow, but is hopeful that they will drop the custody proceedings or come to an agreement in case things don't work out.  Will continue to follow.      05/07/15 1500  Clinical Encounter Type  Visited With Patient  Visit Type Follow-up  Referral From Chaplain  Spiritual Encounters  Spiritual Needs Emotional  Stress Factors  Patient Stress Factors Family relationships;Loss of control

## 2015-05-07 NOTE — Progress Notes (Signed)
Patient ID: Caitlin Greer, female   DOB: May 08, 1986, 29 y.o.   MRN: [id number] Blood pressure 109/67 respiration 18 pulse 84 temp 98 3 Patient started leaking some fluid last night and she's had a couple contractions which she does not feel otherwise her status is unchanged

## 2015-05-08 NOTE — Plan of Care (Signed)
Problem: Phase II Progression Outcomes Goal: Electronic fetal monitoring(Doppler,Continuous,Intermittent) EFM (Doppler, Continuous, Intermittent)  Outcome: Completed/Met Date Met:  05/08/15 NST every shift x 30 minutes

## 2015-05-08 NOTE — Progress Notes (Signed)
Patient ID: Caitlin Greer, female   DOB: 09/17/1985, 29 y.o.   MRN: [id number] Blood pressure 94/57 respiration 20 pulse 86 temp 97 7 Patient has no complaints except for a small amount of leakage yesterday she is not having any contractions tomorrow she'll be [redacted] weeks pregnant and her status is unchanged

## 2015-05-09 LAB — TYPE AND SCREEN
ABO/RH(D): B POS
ANTIBODY SCREEN: NEGATIVE

## 2015-05-09 NOTE — Progress Notes (Signed)
Patient ID: Caitlin Greer, female   DOB: 1986-05-17, 29 y.o.   MRN: [id number] Blood pressure 102/56 respiration 18 pulse 79 afebrile No leakage yesterday No contractions yesterday Status unchanged

## 2015-05-09 NOTE — Progress Notes (Signed)
CSW received message from patient stating she had questions about her "response" for the courts and that her computer was not working.  CSW met with patient, who states her RN helped her.  Patient was very tearful when CSW arrived and seemed appreciative of the support offered by CSW.  Patient states she has lost 2 lbs since being in the hospital and is concerned that it is due to her leaking fluid.  CSW encouraged MOB not to focus on the number on the scale, but rather things that are within her control.  Patient states she is eating and that her appetite has increased.  She states she was told that she can "double up on meats" and that she has done so.  CSW commended her for the steps she is taking to maintain a healthy weight.  Patient was easily redirected and receptive to supportive counseling.   Patient then showed CSW her court documents and asked that CSW review them for typos.  CSW read document and provided support.   Patient's mother arrived with patient's daughter Griffin Dakin to give her something patient had requested from home.  Her mother then quickly turned to leave and patient said, "are you coming back?"  Patient's mother said "no.  Two trips to the hospital in one day is enough."  Patient looked like she was going to cry.  CSW validated her emotions and asked her to remember that her mother is caring for her children while she is in the hospital, in attempts to point out her mother's ongoing support. Patient also needed court documents notarized, which CSW notarized.  Patient stated appreciation for CSW's support and stated no further questions or needs at this time.

## 2015-05-10 NOTE — Progress Notes (Signed)
Patient ID: Caitlin Greer, female   DOB: 01/22/1986, 29 y.o.   MRN: [id number] Blood pressure 97/55 respiration 20 pulse 85 afebrile And a small amount of leakage yesterday no contractions Will order diabetic screening test today

## 2015-05-10 NOTE — Progress Notes (Signed)
I spent time with pt today.  She was feeling very positive today as things have taken an uplifting turn with her SO.  She reported that they are both on the same page right now in wanting to be a family together.  She has a strong faith and that is helping her and her SO with healing from their past.  Now that she has been here for some time, her anxiety about this baby is decreasing too as the baby continues to grow and stay inside longer.  We will continue to be a support for her, but please also page as needs arise.  [place], [place] Pager, [phone] 3:54 PM    05/10/15 1500  Clinical Encounter Type  Visited With Patient  Visit Type Spiritual support  Referral From Chaplain

## 2015-05-11 ENCOUNTER — Inpatient Hospital Stay (HOSPITAL_COMMUNITY): Payer: Medicaid Other | Admitting: Anesthesiology

## 2015-05-11 ENCOUNTER — Encounter (HOSPITAL_COMMUNITY)
Admission: AD | Disposition: A | Discharge status: Home / Self Care | Payer: Self-pay | Source: Ambulatory Visit | Attending: Obstetrics

## 2015-05-11 ENCOUNTER — Encounter (HOSPITAL_COMMUNITY): Payer: Self-pay

## 2015-05-11 DIAGNOSIS — Z98891 History of uterine scar from previous surgery: Secondary | ICD-10-CM

## 2015-05-11 LAB — CBC WITH DIFFERENTIAL/PLATELET
Basophils Absolute: 0 10*3/uL (ref 0.0–0.1)
Basophils Relative: 0 %
EOS ABS: 0.1 10*3/uL (ref 0.0–0.7)
EOS PCT: 0 %
HCT: 34.3 % — ABNORMAL LOW (ref 36.0–46.0)
HEMOGLOBIN: 11.5 g/dL — AB (ref 12.0–15.0)
LYMPHS ABS: 2.8 10*3/uL (ref 0.7–4.0)
Lymphocytes Relative: 15 %
MCH: 31.5 pg (ref 26.0–34.0)
MCHC: 33.5 g/dL (ref 30.0–36.0)
MCV: 94 fL (ref 78.0–100.0)
MONOS PCT: 8 %
Monocytes Absolute: 1.5 10*3/uL — ABNORMAL HIGH (ref 0.1–1.0)
NEUTROS PCT: 77 %
Neutro Abs: 14.5 10*3/uL — ABNORMAL HIGH (ref 1.7–7.7)
Platelets: 230 10*3/uL (ref 150–400)
RBC: 3.65 MIL/uL — ABNORMAL LOW (ref 3.87–5.11)
RDW: 13.5 % (ref 11.5–15.5)
WBC: 18.9 10*3/uL — ABNORMAL HIGH (ref 4.0–10.5)

## 2015-05-11 SURGERY — Surgical Case
Anesthesia: Spinal

## 2015-05-11 MED ORDER — NALBUPHINE HCL 10 MG/ML IJ SOLN: 5.0000 mg | INTRAMUSCULAR | Status: DC | PRN

## 2015-05-11 MED ORDER — LANOLIN HYDROUS EX OINT: 1.0000 "application " | TOPICAL_OINTMENT | CUTANEOUS | Status: DC | PRN

## 2015-05-11 MED ORDER — MORPHINE SULFATE (PF) 0.5 MG/ML IJ SOLN
INTRAMUSCULAR | Status: DC | PRN
  Administered 2015-05-11: 1 mg via INTRAVENOUS
  Administered 2015-05-11: .8 mg via INTRAVENOUS
  Administered 2015-05-11: 1 mg via INTRAVENOUS
  Administered 2015-05-11: .2 mg via EPIDURAL
  Administered 2015-05-11 (×2): 1 mg via INTRAVENOUS

## 2015-05-11 MED ORDER — OXYTOCIN 10 UNIT/ML IJ SOLN
INTRAMUSCULAR | Status: AC
  Filled 2015-05-11: qty 2

## 2015-05-11 MED ORDER — SENNOSIDES-DOCUSATE SODIUM 8.6-50 MG PO TABS
2.0000 | ORAL_TABLET | ORAL | Status: DC
  Administered 2015-05-11 – 2015-05-13 (×3): 2 via ORAL
  Filled 2015-05-11 (×3): qty 2

## 2015-05-11 MED ORDER — BUPIVACAINE IN DEXTROSE 0.75-8.25 % IT SOLN
INTRATHECAL | Status: DC | PRN
  Administered 2015-05-11: 1.6 mL via INTRATHECAL

## 2015-05-11 MED ORDER — OXYCODONE-ACETAMINOPHEN 5-325 MG PO TABS
1.0000 | ORAL_TABLET | ORAL | Status: DC | PRN
  Administered 2015-05-11 – 2015-05-14 (×6): 1 via ORAL
  Filled 2015-05-11 (×7): qty 1

## 2015-05-11 MED ORDER — ZOLPIDEM TARTRATE 5 MG PO TABS: 5.0000 mg | ORAL_TABLET | Freq: Every evening | ORAL | Status: DC | PRN

## 2015-05-11 MED ORDER — ACETAMINOPHEN 325 MG PO TABS
650.0000 mg | ORAL_TABLET | ORAL | Status: DC | PRN
  Administered 2015-05-12: 650 mg via ORAL
  Filled 2015-05-11: qty 2

## 2015-05-11 MED ORDER — NALOXONE HCL 1 MG/ML IJ SOLN: 1.0000 ug/kg/h | INTRAVENOUS | Status: DC | PRN

## 2015-05-11 MED ORDER — ONDANSETRON HCL 4 MG/2ML IJ SOLN: 4.0000 mg | Freq: Three times a day (TID) | INTRAMUSCULAR | Status: DC | PRN

## 2015-05-11 MED ORDER — PHENYLEPHRINE 8 MG IN D5W 100 ML (0.08MG/ML) PREMIX OPTIME
INJECTION | INTRAVENOUS | Status: DC | PRN
  Administered 2015-05-11: 60 ug/min via INTRAVENOUS

## 2015-05-11 MED ORDER — LACTATED RINGERS IV SOLN
INTRAVENOUS | Status: DC | PRN
  Administered 2015-05-11: 07:00:00 via INTRAVENOUS

## 2015-05-11 MED ORDER — PHENYLEPHRINE 8 MG IN D5W 100 ML (0.08MG/ML) PREMIX OPTIME
INJECTION | INTRAVENOUS | Status: AC
  Filled 2015-05-11: qty 100

## 2015-05-11 MED ORDER — CITRIC ACID-SODIUM CITRATE 334-500 MG/5ML PO SOLN
ORAL | Status: AC
  Filled 2015-05-11: qty 15

## 2015-05-11 MED ORDER — WITCH HAZEL-GLYCERIN EX PADS: 1.0000 "application " | MEDICATED_PAD | CUTANEOUS | Status: DC | PRN

## 2015-05-11 MED ORDER — FENTANYL CITRATE (PF) 100 MCG/2ML IJ SOLN
INTRAMUSCULAR | Status: DC | PRN
  Administered 2015-05-11: 50 ug via INTRAVENOUS
  Administered 2015-05-11: 40 ug via INTRAVENOUS
  Administered 2015-05-11: 10 ug via INTRAVENOUS

## 2015-05-11 MED ORDER — SIMETHICONE 80 MG PO CHEW
80.0000 mg | CHEWABLE_TABLET | Freq: Three times a day (TID) | ORAL | Status: DC
  Administered 2015-05-12 – 2015-05-13 (×7): 80 mg via ORAL
  Filled 2015-05-11 (×7): qty 1

## 2015-05-11 MED ORDER — LACTATED RINGERS IV SOLN
INTRAVENOUS | Status: DC | PRN
  Administered 2015-05-11 (×2): via INTRAVENOUS

## 2015-05-11 MED ORDER — SIMETHICONE 80 MG PO CHEW
80.0000 mg | CHEWABLE_TABLET | ORAL | Status: DC
  Administered 2015-05-11 – 2015-05-13 (×3): 80 mg via ORAL
  Filled 2015-05-11 (×3): qty 1

## 2015-05-11 MED ORDER — TETANUS-DIPHTH-ACELL PERTUSSIS 5-2.5-18.5 LF-MCG/0.5 IM SUSP: 0.5000 mL | Freq: Once | INTRAMUSCULAR | Status: DC

## 2015-05-11 MED ORDER — NALOXONE HCL 0.4 MG/ML IJ SOLN: 0.4000 mg | INTRAMUSCULAR | Status: DC | PRN

## 2015-05-11 MED ORDER — FENTANYL CITRATE (PF) 100 MCG/2ML IJ SOLN
INTRAMUSCULAR | Status: AC
  Administered 2015-05-11: 25 ug via INTRAVENOUS
  Filled 2015-05-11: qty 2

## 2015-05-11 MED ORDER — CEFAZOLIN SODIUM-DEXTROSE 2-3 GM-% IV SOLR
2.0000 g | Freq: Three times a day (TID) | INTRAVENOUS | Status: DC
  Administered 2015-05-11: 2 g via INTRAVENOUS
  Filled 2015-05-11 (×3): qty 50

## 2015-05-11 MED ORDER — SCOPOLAMINE 1 MG/3DAYS TD PT72
MEDICATED_PATCH | TRANSDERMAL | Status: DC | PRN
  Administered 2015-05-11: 1 via TRANSDERMAL

## 2015-05-11 MED ORDER — DIPHENHYDRAMINE HCL 25 MG PO CAPS: 25.0000 mg | ORAL_CAPSULE | ORAL | Status: DC | PRN

## 2015-05-11 MED ORDER — ONDANSETRON HCL 4 MG/2ML IJ SOLN
INTRAMUSCULAR | Status: DC | PRN
  Administered 2015-05-11: 4 mg via INTRAVENOUS

## 2015-05-11 MED ORDER — LACTATED RINGERS IV SOLN
INTRAVENOUS | Status: DC
  Administered 2015-05-11: 19:00:00 via INTRAVENOUS

## 2015-05-11 MED ORDER — NALBUPHINE HCL 10 MG/ML IJ SOLN: 5.0000 mg | Freq: Once | INTRAMUSCULAR | Status: DC | PRN

## 2015-05-11 MED ORDER — FENTANYL CITRATE (PF) 100 MCG/2ML IJ SOLN
25.0000 ug | INTRAMUSCULAR | Status: DC | PRN
  Administered 2015-05-11: 50 ug via INTRAVENOUS
  Administered 2015-05-11 (×2): 25 ug via INTRAVENOUS

## 2015-05-11 MED ORDER — SCOPOLAMINE 1 MG/3DAYS TD PT72
MEDICATED_PATCH | TRANSDERMAL | Status: AC
  Filled 2015-05-11: qty 1

## 2015-05-11 MED ORDER — LACTATED RINGERS IV SOLN
40.0000 [IU] | INTRAVENOUS | Status: DC | PRN
  Administered 2015-05-11: 40 [IU] via INTRAVENOUS

## 2015-05-11 MED ORDER — DIBUCAINE 1 % RE OINT: 1.0000 "application " | TOPICAL_OINTMENT | RECTAL | Status: DC | PRN

## 2015-05-11 MED ORDER — PRENATAL MULTIVITAMIN CH
1.0000 | ORAL_TABLET | Freq: Every day | ORAL | Status: DC
  Administered 2015-05-12 – 2015-05-13 (×2): 1 via ORAL
  Filled 2015-05-11 (×2): qty 1

## 2015-05-11 MED ORDER — ONDANSETRON HCL 4 MG/2ML IJ SOLN
INTRAMUSCULAR | Status: AC
  Filled 2015-05-11: qty 2

## 2015-05-11 MED ORDER — CITRIC ACID-SODIUM CITRATE 334-500 MG/5ML PO SOLN: 30.0000 mL | Freq: Once | ORAL | Status: DC

## 2015-05-11 MED ORDER — MORPHINE SULFATE (PF) 0.5 MG/ML IJ SOLN
INTRAMUSCULAR | Status: AC
  Filled 2015-05-11: qty 100

## 2015-05-11 MED ORDER — BUTORPHANOL TARTRATE 1 MG/ML IJ SOLN
INTRAMUSCULAR | Status: AC
  Filled 2015-05-11: qty 1

## 2015-05-11 MED ORDER — PROMETHAZINE HCL 25 MG/ML IJ SOLN
12.5000 mg | Freq: Once | INTRAMUSCULAR | Status: AC
  Administered 2015-05-11: 12.5 mg via INTRAVENOUS
  Filled 2015-05-11: qty 1

## 2015-05-11 MED ORDER — FENTANYL CITRATE (PF) 100 MCG/2ML IJ SOLN
INTRAMUSCULAR | Status: AC
  Filled 2015-05-11: qty 4

## 2015-05-11 MED ORDER — ONDANSETRON HCL 4 MG/2ML IJ SOLN: 4.0000 mg | Freq: Once | INTRAMUSCULAR | Status: DC | PRN

## 2015-05-11 MED ORDER — IBUPROFEN 600 MG PO TABS
600.0000 mg | ORAL_TABLET | Freq: Four times a day (QID) | ORAL | Status: DC
  Administered 2015-05-11 – 2015-05-14 (×11): 600 mg via ORAL
  Filled 2015-05-11 (×11): qty 1

## 2015-05-11 MED ORDER — BUTORPHANOL TARTRATE 1 MG/ML IJ SOLN
1.0000 mg | Freq: Once | INTRAMUSCULAR | Status: AC
  Administered 2015-05-11: 1 mg via INTRAVENOUS

## 2015-05-11 MED ORDER — DIPHENHYDRAMINE HCL 50 MG/ML IJ SOLN: 12.5000 mg | INTRAMUSCULAR | Status: DC | PRN

## 2015-05-11 MED ORDER — CEFAZOLIN SODIUM-DEXTROSE 2-3 GM-% IV SOLR
INTRAVENOUS | Status: AC
  Filled 2015-05-11: qty 50

## 2015-05-11 MED ORDER — DIPHENHYDRAMINE HCL 25 MG PO CAPS: 25.0000 mg | ORAL_CAPSULE | Freq: Four times a day (QID) | ORAL | Status: DC | PRN

## 2015-05-11 MED ORDER — OXYTOCIN 40 UNITS IN LACTATED RINGERS INFUSION - SIMPLE MED: 62.5000 mL/h | INTRAVENOUS | Status: AC

## 2015-05-11 MED ORDER — KETOROLAC TROMETHAMINE 30 MG/ML IJ SOLN: 30.0000 mg | Freq: Four times a day (QID) | INTRAMUSCULAR | Status: DC | PRN

## 2015-05-11 MED ORDER — SCOPOLAMINE 1 MG/3DAYS TD PT72: 1.0000 | MEDICATED_PATCH | Freq: Once | TRANSDERMAL | Status: DC

## 2015-05-11 MED ORDER — OXYCODONE-ACETAMINOPHEN 5-325 MG PO TABS: 2.0000 | ORAL_TABLET | ORAL | Status: DC | PRN

## 2015-05-11 MED ORDER — MEPERIDINE HCL 25 MG/ML IJ SOLN: 6.2500 mg | INTRAMUSCULAR | Status: DC | PRN

## 2015-05-11 MED ORDER — SODIUM CHLORIDE 0.9 % IJ SOLN: 3.0000 mL | INTRAMUSCULAR | Status: DC | PRN

## 2015-05-11 MED ORDER — MENTHOL 3 MG MT LOZG: 1.0000 | LOZENGE | OROMUCOSAL | Status: DC | PRN

## 2015-05-11 MED ORDER — KETOROLAC TROMETHAMINE 30 MG/ML IJ SOLN
INTRAMUSCULAR | Status: AC
  Administered 2015-05-11: 30 mg via INTRAMUSCULAR
  Filled 2015-05-11: qty 1

## 2015-05-11 MED ORDER — KETOROLAC TROMETHAMINE 30 MG/ML IJ SOLN
30.0000 mg | Freq: Four times a day (QID) | INTRAMUSCULAR | Status: DC | PRN
  Administered 2015-05-11: 30 mg via INTRAMUSCULAR

## 2015-05-11 MED ORDER — SIMETHICONE 80 MG PO CHEW: 80.0000 mg | CHEWABLE_TABLET | ORAL | Status: DC | PRN

## 2015-05-11 SURGICAL SUPPLY — 32 items
CLAMP CORD UMBIL (MISCELLANEOUS) IMPLANT
CLOTH BEACON ORANGE TIMEOUT ST (SAFETY) ×3 IMPLANT
DRAPE SHEET LG 3/4 BI-LAMINATE (DRAPES) IMPLANT
DRSG OPSITE POSTOP 4X10 (GAUZE/BANDAGES/DRESSINGS) ×3 IMPLANT
DURAPREP 26ML APPLICATOR (WOUND CARE) ×3 IMPLANT
ELECT REM PT RETURN 9FT ADLT (ELECTROSURGICAL) ×3
ELECTRODE REM PT RTRN 9FT ADLT (ELECTROSURGICAL) ×1 IMPLANT
EXTRACTOR VACUUM M CUP 4 TUBE (SUCTIONS) IMPLANT
EXTRACTOR VACUUM M CUP 4' TUBE (SUCTIONS)
GLOVE BIO SURGEON STRL SZ8.5 (GLOVE) ×3 IMPLANT
GOWN STRL REUS W/TWL 2XL LVL3 (GOWN DISPOSABLE) ×3 IMPLANT
GOWN STRL REUS W/TWL LRG LVL3 (GOWN DISPOSABLE) ×3 IMPLANT
KIT ABG SYR 3ML LUER SLIP (SYRINGE) IMPLANT
NEEDLE HYPO 25X5/8 SAFETYGLIDE (NEEDLE) IMPLANT
NS IRRIG 1000ML POUR BTL (IV SOLUTION) ×3 IMPLANT
PACK C SECTION WH (CUSTOM PROCEDURE TRAY) ×3 IMPLANT
PAD OB MATERNITY 4.3X12.25 (PERSONAL CARE ITEMS) ×3 IMPLANT
PENCIL SMOKE EVAC W/HOLSTER (ELECTROSURGICAL) ×3 IMPLANT
SUT CHROMIC 0 CT 802H (SUTURE) ×3 IMPLANT
SUT CHROMIC 0 CTX 36 (SUTURE) ×3 IMPLANT
SUT CHROMIC 0 MO4 CR (SUTURE) IMPLANT
SUT CHROMIC 1 CTX 36 (SUTURE) ×6 IMPLANT
SUT CHROMIC 2 0 SH (SUTURE) ×3 IMPLANT
SUT GUT PLAIN 0 CT-3 TAN 27 (SUTURE) IMPLANT
SUT MON AB 4-0 PS1 27 (SUTURE) ×3 IMPLANT
SUT PDS AB 0 CTX 36 PDP370T (SUTURE) IMPLANT
SUT VIC AB 0 CT1 18XCR BRD8 (SUTURE) IMPLANT
SUT VIC AB 0 CT1 8-18 (SUTURE)
SUT VIC AB 0 CTX 36 (SUTURE) ×4
SUT VIC AB 0 CTX36XBRD ANBCTRL (SUTURE) ×2 IMPLANT
TOWEL OR 17X24 6PK STRL BLUE (TOWEL DISPOSABLE) ×3 IMPLANT
TRAY FOLEY CATH SILVER 14FR (SET/KITS/TRAYS/PACK) ×3 IMPLANT

## 2015-05-11 NOTE — Progress Notes (Addendum)
Received referral from SW regarding patient.  Chaplain had planned to visit with patient today after hearing the news of delivery.  Chaplain was later notified by CSW that pt's baby had died and that pt requested a visit from the chaplain tomorrow (Saturday) and did not want a visit today.  Spiritual services will follow up with patient on Saturday.     05/11/15 1330  Clinical Encounter Type  Visited With Patient not available  Visit Type Follow-up;Spiritual support  Referral From Social work

## 2015-05-11 NOTE — Progress Notes (Signed)
CSW stayed with MOB, with her permission, at baby's bedside while NICU coded infant for emotional support.  MOB appropriately tearful as baby passed.  MOB held baby for a few minutes and was informed by CSW that she can hold again in her room if she wishes.  MOB thanked CSW.  She specifically asked CSW to have a Chaplain visit her tomorrow, not today.  CSW notified [place].

## 2015-05-11 NOTE — Progress Notes (Signed)
CSW received call from MOB asking if CSW had faxed the letter to Child Support.  CSW explained that the letter was faxed yesterday.  CSW asked if MOB would feel up to CSW coming to see her today and she said yes.  CSW met with MOB to provide ongoing support now that baby has delivered.  MOB was groggy and CSW did not stay long.  CSW mentioned that weekend CSW will check in with her, but MOB requested that this CSW check in with her on Monday.  CSW agreed.   

## 2015-05-11 NOTE — Anesthesia Preprocedure Evaluation (Signed)
Anesthesia Evaluation  Patient identified by MRN, date of birth, ID band Patient awake    Reviewed: Allergy & Precautions, NPO status , Patient's Chart, lab work & pertinent test results  History of Anesthesia Complications Negative for: history of anesthetic complications  Airway Mallampati: II  TM Distance: >3 FB Neck ROM: Full    Dental no notable dental hx. (+) Dental Advisory Given   Pulmonary asthma ,    Pulmonary exam normal breath sounds clear to auscultation       Cardiovascular hypertension, Normal cardiovascular exam Rhythm:Regular Rate:Normal     Neuro/Psych negative neurological ROS  negative psych ROS   GI/Hepatic negative GI ROS, Neg liver ROS,   Endo/Other  negative endocrine ROS  Renal/GU negative Renal ROS  negative genitourinary   Musculoskeletal negative musculoskeletal ROS (+)   Abdominal   Peds negative pediatric ROS (+)  Hematology negative hematology ROS (+)   Anesthesia Other Findings   Reproductive/Obstetrics (+) Pregnancy                             Anesthesia Physical Anesthesia Plan  ASA: II  Anesthesia Plan: Spinal   Post-op Pain Management:    Induction:   Airway Management Planned:   Additional Equipment:   Intra-op Plan:   Post-operative Plan:   Informed Consent: I have reviewed the patients History and Physical, chart, labs and discussed the procedure including the risks, benefits and alternatives for the proposed anesthesia with the patient or authorized representative who has indicated his/her understanding and acceptance.   Dental advisory given  Plan Discussed with:   Anesthesia Plan Comments:         Anesthesia Quick Evaluation

## 2015-05-11 NOTE — Progress Notes (Addendum)
CSW faxed letter verifying patients inpatient hospitalization to [facility] in Child Support per MOB's request.

## 2015-05-11 NOTE — H&P (Signed)
Patient remains afebrile her white count 18,000 She's starting labor and her discharge has increased cervix is 3 cm an ultrasound shows breech presentation she is 25 weeks and 3 days and has been delivered by this repeat C-section because of prolonged ruptured membranes 25+ weeks active labor and breech presentation

## 2015-05-11 NOTE — Progress Notes (Signed)
Patient changed her mind and took Tylenol at 88. Pt stated pain worsen and that she was now feeling pressure in her rectum when in to reevaluate. Dr. Jodi Mourning notified and order for CBC, Stadol, and Phenergan. New IV site started, Stadol given. FHR declined, fluid bolus given, pt repositioned, and oxygen applied. Pt cervix checked and noted dilated 3 cm. MD notified again.

## 2015-05-11 NOTE — Anesthesia Postprocedure Evaluation (Signed)
Anesthesia Post Note  Patient: Caitlin Greer  Procedure(s) Performed: Procedure(s) (LRB): CESAREAN SECTION (N/A)  Anesthesia type: Spinal  Patient location: Mother/Baby  Post pain: Pain level controlled  Post assessment: Post-op Vital signs reviewed  Last Vitals:  Filed Vitals:   05/11/15 1400  BP: 113/67  Pulse: 86  Temp: 37 C  Resp: 16    Post vital signs: Reviewed  Level of consciousness:alert  Complications: No apparent anesthesia complications

## 2015-05-11 NOTE — Addendum Note (Signed)
Addendum  created 05/11/15 1429 by Lenox Ponds, CRNA   Modules edited: Notes Section   Notes Section:  File: [id number]

## 2015-05-11 NOTE — Op Note (Signed)
Preop diagnosis premature ruptured membranes at 23 weeks she is now 25 weeks and 2 days active labor breech presentation Postop diagnosis classical cesarean section Surgeon Dr. Gracy Racer First assistant Dr. Baltazar Najjar Anesthesia spinal Procedure patient placed on the operating table in the supine position after the spinal administered abdomen prepped and draped bladder emptied with a Foley catheter a transverse suprapubic incision made carried down to the rectus fascia fascia cleaned and incised length of the incision recti muscles retracted laterally peritoneum incised longitudinally No lower segment midline classical uterine incision made and the patient delivered of a frank breech female Apgar   unknown at this time placenta was removed manually and sent to pathology uterine cavity clean with dry laps uterine incision closed in 3 layers with continuous suture #1 chromic hemostasis was satisfactory lap and sponge counts correct abdomen closed in layers peritoneum continuous with of 0 chromic fascia continuous with  0 dexon  skin closes subcuticular stitch of 4-0 Monocryl blood loss was 500 cc patient tolerated the procedure well

## 2015-05-11 NOTE — Progress Notes (Signed)
Pt complained of right lower quadrant pain rated 6-7 and feeling chills. Vitals 98.7T, 98/64bp, 88p, 16r. Decline Tylenol. Will continue to monitor.

## 2015-05-11 NOTE — Anesthesia Postprocedure Evaluation (Signed)
  Anesthesia Post-op Note  Patient: Caitlin Greer  Procedure(s) Performed: Procedure(s): CESAREAN SECTION (N/A)  Patient Location: PACU  Anesthesia Type:Spinal  Level of Consciousness: awake and alert   Airway and Oxygen Therapy: Patient Spontanous Breathing  Post-op Pain: mild  Post-op Assessment: Post-op Vital signs reviewed, Patient's Cardiovascular Status Stable, Respiratory Function Stable and Patent Airway              Post-op Vital Signs: Reviewed and stable  Last Vitals:  Filed Vitals:   05/11/15 0830  BP:   Pulse:   Temp:   Resp: 15    Complications: No apparent anesthesia complications

## 2015-05-11 NOTE — Anesthesia Procedure Notes (Signed)
Spinal Patient location during procedure: OR Staffing Anesthesiologist: Sheneika Walstad Performed by: anesthesiologist  Preanesthetic Checklist Completed: patient identified, site marked, surgical consent, pre-op evaluation, timeout performed, IV checked, risks and benefits discussed and monitors and equipment checked Spinal Block Patient position: sitting Prep: ChloraPrep Patient monitoring: continuous pulse ox, blood pressure and heart rate Approach: midline Injection technique: single-shot Needle Needle type: Sprotte  Needle gauge: 24 G Needle length: 9 cm Additional Notes Functioning IV was confirmed and monitors were applied. Sterile prep and drape, including hand hygiene, mask and sterile gloves were used. The patient was positioned and the spine was prepped. The skin was anesthetized with lidocaine.  Free flow of clear CSF was obtained prior to injecting local anesthetic into the CSF.  The spinal needle aspirated freely following injection.  The needle was carefully withdrawn.  The patient tolerated the procedure well. Consent was obtained prior to procedure with all questions answered and concerns addressed. Risks including but not limited to bleeding, infection, nerve damage, paralysis, failed block, inadequate analgesia, allergic reaction, high spinal, itching and headache were discussed and the patient wished to proceed.   Lauretta Grill, MD

## 2015-05-11 NOTE — Transfer of Care (Signed)
Immediate Anesthesia Transfer of Care Note  Patient: Caitlin Greer  Procedure(s) Performed: Procedure(s): CESAREAN SECTION (N/A)  Patient Location: PACU  Anesthesia Type:Spinal  Level of Consciousness: awake, alert  and oriented  Airway & Oxygen Therapy: Patient Spontanous Breathing  Post-op Assessment: Report given to RN and Post -op Vital signs reviewed and stable  Post vital signs: Reviewed and stable  Last Vitals:  Filed Vitals:   05/11/15 0502  BP:   Pulse:   Temp: 37.6 C  Resp:     Complications: No apparent anesthesia complications

## 2015-05-12 LAB — CBC
HCT: 26.2 % — ABNORMAL LOW (ref 36.0–46.0)
HEMOGLOBIN: 8.7 g/dL — AB (ref 12.0–15.0)
MCH: 31.2 pg (ref 26.0–34.0)
MCHC: 33.2 g/dL (ref 30.0–36.0)
MCV: 93.9 fL (ref 78.0–100.0)
Platelets: 175 10*3/uL (ref 150–400)
RBC: 2.79 MIL/uL — AB (ref 3.87–5.11)
RDW: 13.6 % (ref 11.5–15.5)
WBC: 15.6 10*3/uL — ABNORMAL HIGH (ref 4.0–10.5)

## 2015-05-12 MED ORDER — LACTATED RINGERS IV BOLUS (SEPSIS)
1000.0000 mL | Freq: Once | INTRAVENOUS | Status: AC
  Administered 2015-05-12: 1000 mL via INTRAVENOUS

## 2015-05-12 NOTE — Progress Notes (Signed)
Patient verbalized that she and the father of the baby have decided on [facility].  House Coverage notified.

## 2015-05-12 NOTE — Progress Notes (Signed)
Patient up to bathroom to void.  Tolerated well.  Denies dizziness or "feeling faint".

## 2015-05-12 NOTE — Progress Notes (Signed)
25 - Dr. Ruthann Cancer notified of patient's CBC and improved BP (see flowsheet).  Dr. Ruthann Cancer gave order for patient to receive a one liter bolus of LR and then saline lock.

## 2015-05-12 NOTE — Progress Notes (Signed)
       05/12/15 1344  Clinical Encounter Type  Visited With Patient declned  Visit Type Follow-up;Spiritual support  Referral From  [facility]     Chaplain referral by staff. Ms Reaney decline spiritual care. Please page chaplain if Ms Duprey wishes spiritual or emotional care.

## 2015-05-12 NOTE — Progress Notes (Signed)
Patient ID: Caitlin Greer, female   DOB: 04-16-1986, 29 y.o.   MRN: [id number] Postop day 1 Blood pressure 100/57 respiration 18 pulse 72 afebrile Abdomen soft incision clean and dry Legs negative   baby died  Because  of immature lungs yesterday patient does not need any additional medication she's  Coping  well

## 2015-05-12 NOTE — Progress Notes (Signed)
Patient rang call bell.  RN went to room.  Patient sitting on side of bed.  Patient stated that she had been to the bathroom and "felt dizzy like I was going to pass out so I got back to the bed really fast."  Patient reports feeling a little dizzy while sitting on edge of bed.  VS per flowsheet.  Patient lying in bed now talking on telephone.  Dr. Ruthann Cancer notified of above and VS [phone].  Dr. Ruthann Cancer gave order for CBC.  Lab notified of order.

## 2015-05-12 NOTE — Consult Note (Signed)
LC to see Mom and give condolences and ask what assist we could be to her. Mom reports she plans to pump and bottle feed any breastmilk that she can produce to give to her 66 month old. Mom requested LC to set up DEBP for her to use while in hospital, but when [facility] returned with pump Mom changed her mind and reports she will start pumping when she gets home. Mom reports she has DEBP at home and will have family member bring in to have pressures checked to see if working properly. LC advised Mom to call for any questions/concerns.

## 2015-05-13 ENCOUNTER — Encounter (HOSPITAL_COMMUNITY): Payer: Self-pay | Admitting: *Deleted

## 2015-05-13 NOTE — Progress Notes (Signed)
Patient ID: Caitlin Greer, female   DOB: 01/16/1986, 29 y.o.   MRN: [id number] Postop day 2 Blood pressure 106/58 hemoglobin respiration 18 pulse 76 afebrile hemoglobin postop 8.9 Abdomen soft No complaints Doing well

## 2015-05-13 NOTE — Progress Notes (Signed)
08:15 Pt called out stating that she wanted to see the charge nurse. Went in to see pt whom I had cared for on Fri when she was on antenatal. Pt stated that she had concerns about the care that she had received at [facility] ED on 9/26 & wanted to talk to Risk Management. 18:00 Amy Jerline Pain, Risk Management notified by email to see patient prior to discharge tomorrow.

## 2015-05-14 ENCOUNTER — Encounter (HOSPITAL_COMMUNITY): Payer: Self-pay | Admitting: Obstetrics

## 2015-05-14 NOTE — Progress Notes (Signed)
Follow up visit with patient who requested information to support her 31 year old daughter Gertha Calkin in her grief after the loss of her baby Sail[place].  Ms. Pirro has not wanted chaplain support until today.  Facilitated expression of emotions related to her delivery and subsequent demise of her pre term infant.  Ms. Chaires has had a difficult several weeks leading up to the delivery as she has been dealing with relationship problems with the FOB (who is also father of her 74 month old).  She states that their relationship had gotten particularly volatile and he had been vying for custody of their daughter Griffin Dakin.  She was then hospitalized and many of their relationship stress dissipated as they were both focused on the health of the baby. Ms Danser states that she wants to focus on the good memories and good times they had prior to all of their stress and feels like God gave them Griffin Dakin because he knew they had tremendous love to share with her, but that God was angry at them for fighting and disobeying commandments.  She says she feels like Sailor's purpose was to heal their relationship and she finds some comfort in knowing that she did that.  She says that Wille Glaser has been very supportive of her during this difficult time and she feels like they've grown closer to her in the midst of this sad time.  Ms Alcaraz processed the story of [place] birth and subsequent death and shared that she's really worried about Gertha Calkin and wants to focus on her.  Chaplain shared resources for support from [employer] and informed pt about free grief counseling and some upcoming workshops for families with children.  Chaplain encouraged pt to take time to care for herself and be kind to herself.  Chaplain reminded pt that grief comes in waves and stages and encouraged her to find folks to talk to that she does not have to provide reciprocal emotional support for.  Affirmed the difficult work pt has done so far and encouraged her to follow up in the  chaplain's office if she would like to talk more.        05/14/15 1128  Clinical Encounter Type  Visited With Patient  Visit Type Follow-up;Death  Referral From Chaplain;Nurse  Spiritual Encounters  Spiritual Needs Emotional;Grief support

## 2015-05-14 NOTE — Discharge Instructions (Signed)
Discharge instructions   You can wash your hair  Shower  Eat what you want  Drink what you want  See me in 6 weeks  Your ankles are going to swell more in the next 2 weeks than when pregnant  No sex for 6 weeks   Jashley Yellin A, MD 05/14/2015

## 2015-05-14 NOTE — Progress Notes (Signed)
Patient ID: Caitlin Greer, female   DOB: 1986/01/27, 29 y.o.   MRN: [id number] Postop day 3 Blood pressure 105/73 pulse 80 respiration 20 afebrile Incision clean and dry Abdomen soft Legs negative patient had ruptured membranes at 23 weeks and C-section 25 weeks and a fetus did not survive she is doing well discharge today

## 2015-05-14 NOTE — Discharge Summary (Signed)
Obstetric Discharge Summary Reason for Admission: rupture of membranes Prenatal Procedures: ultrasound Intrapartum Procedures: cesarean: classical Postpartum Procedures: none Complications-Operative and Postpartum: none HEMOGLOBIN  Date Value Ref Range Status  05/12/2015 8.7* 12.0 - 15.0 g/dL Final    Comment:    REPEATED TO VERIFY DELTA CHECK NOTED    HCT  Date Value Ref Range Status  05/12/2015 26.2* 36.0 - 46.0 % Final    Physical Exam:  General: alert Lochia: appropriate Uterine Fundus: firm Incision: healing well DVT Evaluation: No evidence of DVT seen on physical exam.  Discharge Diagnoses: Term Pregnancy-delivered  Discharge Information: Date: 05/14/2015 Activity: pelvic rest Diet: routine Medications: Percocet Condition: improved Instructions: refer to practice specific booklet Discharge to: home Follow-up Information    Follow up with Frederico Hamman, MD.   Specialty:  Obstetrics and Gynecology   Contact information:   [address] [place] [address] [phone]       Newborn Data: Live born female  Birth Weight: 1 lb 8.7 oz (700 g) APGAR: 2, 4  Home with fetal demise.  Caitlin Greer A 05/14/2015, 6:45 AM

## 2015-05-14 NOTE — Progress Notes (Signed)
Pt is discharged in the care of Mother per wheelchair with N.T.. No equipment needed for home use. Stable. Abdominal incion is clean and dry. Spirits are better. Emotional support given due to lost. Information also given for her daughter. Chaplain referral also given for beavement.  Discharged instructions with Rx were given to pt. With good understanding. Questions asked and answered. Stable

## 2015-10-16 ENCOUNTER — Other Ambulatory Visit (HOSPITAL_COMMUNITY)
Admission: RE | Admit: 2015-10-16 | Discharge: 2015-10-16 | Disposition: A | Discharge status: Home / Self Care | Payer: Medicaid Other | Source: Ambulatory Visit | Attending: Family Medicine | Admitting: Family Medicine

## 2015-10-16 ENCOUNTER — Emergency Department (HOSPITAL_COMMUNITY)
Admission: EM | Admit: 2015-10-16 | Discharge: 2015-10-16 | Disposition: A | Discharge status: Home / Self Care | Payer: Medicaid Other | Source: Home / Self Care | Attending: Family Medicine | Admitting: Family Medicine

## 2015-10-16 DIAGNOSIS — B349 Viral infection, unspecified: Secondary | ICD-10-CM | POA: Insufficient documentation

## 2015-10-16 DIAGNOSIS — K2970 Gastritis, unspecified, without bleeding: Secondary | ICD-10-CM

## 2015-10-16 DIAGNOSIS — B9789 Other viral agents as the cause of diseases classified elsewhere: Secondary | ICD-10-CM

## 2015-10-16 DIAGNOSIS — J988 Other specified respiratory disorders: Principal | ICD-10-CM

## 2015-10-16 LAB — POCT RAPID STREP A: STREPTOCOCCUS, GROUP A SCREEN (DIRECT): NEGATIVE

## 2015-10-16 MED ORDER — ONDANSETRON HCL 4 MG PO TABS: 4.0000 mg | ORAL_TABLET | Freq: Four times a day (QID) | ORAL | Status: DC

## 2015-10-16 MED ORDER — ONDANSETRON 4 MG PO TBDP
4.0000 mg | ORAL_TABLET | Freq: Once | ORAL | Status: AC
  Administered 2015-10-16: 4 mg via ORAL

## 2015-10-16 MED ORDER — ONDANSETRON 4 MG PO TBDP
ORAL_TABLET | ORAL | Status: AC
  Filled 2015-10-16: qty 4

## 2015-10-16 NOTE — ED Provider Notes (Signed)
CSN: [id number]     Arrival date & time 10/16/15  1301 History   First MD Initiated Contact with Patient 10/16/15 1317     Chief Complaint  Patient presents with  . Emesis  . Fever  . Cough   (Consider location/radiation/quality/duration/timing/severity/associated sxs/prior Treatment) HPI Pt presents with sore throat, body aches, fever, chills for 4 days Home treatment has been OTC meds without much relief of symptoms Fever is improved for short periods of time with OTC antipyretics. Pain score is 4 mostly from coughing and body aches Taking fluids, no appetite, vomiting,  No flu shot Has been exposed to others with similar symptoms. (daughter) Denies: CP, SOB, diarrhea.   Past Medical History  Diagnosis Date  . Hypertension     not currently on meds  . Eczema   . Asthma     managed with rescue inhaler only; last used 8 months ago  . Fibroid 2016    uterus  . Infection 2015    UTI; was treated  . Anemia    Past Surgical History  Procedure Laterality Date  . Therapeutic abortion    . Cesarean section  2006    classical vertical skin incision; transverse incision on uterus  . Cesarean section N/A 05/11/2015    Procedure: CESAREAN SECTION;  Surgeon: Frederico Hamman, MD;  Location: [facility];  Service: Obstetrics;  Laterality: N/A;   Family History  Problem Relation Age of Onset  . Adopted: Yes   Social History  Substance Use Topics  . Smoking status: Never Smoker   . Smokeless tobacco: Never Used  . Alcohol Use: No     Comment: occ   OB History    Gravida Para Term Preterm AB TAB SAB Ectopic Multiple Living   5 3 2 1 2 1 1  0 0 2     Review of Systems Cough, runny nose, vomiting Allergies  Review of patient's allergies indicates no known allergies.  Home Medications   Prior to Admission medications   Not on File   Meds Ordered and Administered this Visit  Medications - No data to display  BP 131/89 mmHg  Pulse 71  Temp(Src) 98.7 F (37.1 C) (Oral)   Resp 16  SpO2 99%  LMP 08/05/2015 (Approximate) No data found.   Physical Exam NURSES NOTES AND VITAL SIGNS REVIEWED. CONSTITUTIONAL: Well developed, well nourished, no acute distress HEENT: normocephalic, atraumatic, right and left TM's are normal EYES: Conjunctiva normal NECK:normal ROM, supple, no adenopathy PULMONARY:No respiratory distress, normal effort, Lungs: CTAb/l, no wheezes, or increased work of breathing CARDIOVASCULAR: RRR, no murmur ABDOMEN: soft, ND, NT, +'ve BS MUSCULOSKELETAL: Normal ROM of all extremities,  SKIN: warm and dry without rash PSYCHIATRIC: Mood and affect, behavior are normal  ED Course  Procedures (including critical care time)  Labs Review Labs Reviewed - No data to display  Imaging Review No results found.   Visual Acuity Review  Right Eye Distance:   Left Eye Distance:   Bilateral Distance:    Right Eye Near:   Left Eye Near:    Bilateral Near:       RBS is negative No antibx indicated at this time  MDM   1. Viral respiratory illness   2. Gastritis     Patient is reassured that there are no issues that require transfer to higher level of care at this time or additional tests. Patient is advised to continue home symptomatic treatment. Patient is advised that if there are new or worsening  symptoms to attend the emergency department, contact primary care provider, or return to UC. Instructions of care provided discharged home in stable condition. Return to work/school note provided.   THIS NOTE WAS GENERATED USING A VOICE RECOGNITION SOFTWARE PROGRAM. ALL REASONABLE EFFORTS  WERE MADE TO PROOFREAD THIS DOCUMENT FOR ACCURACY.  I have verbally reviewed the discharge instructions with the patient. A printed AVS was given to the patient.  All questions were answered prior to discharge.      Konrad Felix, PA 10/16/15 [place], [place] 10/16/15 [phone]

## 2015-10-16 NOTE — ED Notes (Signed)
Pt has been a cough,sore throat, fever and vomiting since Saturday that is not getting any better. Pt alert and oriented Pt does not currently have a fever

## 2015-10-16 NOTE — Discharge Instructions (Signed)
Gastritis, Adult Gastritis is soreness and puffiness (inflammation) of the lining of the stomach. If you do not get help, gastritis can cause bleeding and sores (ulcers) in the stomach. HOME CARE   Only take medicine as told by your doctor.  If you were given antibiotic medicines, take them as told. Finish the medicines even if you start to feel better.  Drink enough fluids to keep your pee (urine) clear or pale yellow.  Avoid foods and drinks that make your problems worse. Foods you may want to avoid include:  Caffeine or alcohol.  Chocolate.  Mint.  Garlic and onions.  Spicy foods.  Citrus fruits, including oranges, lemons, or limes.  Food containing tomatoes, including sauce, chili, salsa, and pizza.  Fried and fatty foods.  Eat small meals throughout the day instead of large meals. GET HELP RIGHT AWAY IF:   You have black or dark red poop (stools).  You throw up (vomit) blood. It may look like coffee grounds.  You cannot keep fluids down.  Your belly (abdominal) pain gets worse.  You have a fever.  You do not feel better after 1 week.  You have any other questions or concerns. MAKE SURE YOU:   Understand these instructions.  Will watch your condition.  Will get help right away if you are not doing well or get worse.   This information is not intended to replace advice given to you by your health care provider. Make sure you discuss any questions you have with your health care provider.   Document Released: 12/31/2007 Document Revised: 10/06/2011 Document Reviewed: 08/27/2011 Elsevier Interactive Patient Education 2016 Elsevier Inc. Influenza, Adult Influenza ("the flu") is a viral infection of the respiratory tract. It occurs more often in winter months because people spend more time in close contact with one another. Influenza can make you feel very sick. Influenza easily spreads from person to person (contagious). CAUSES  Influenza is caused by a  virus that infects the respiratory tract. You can catch the virus by breathing in droplets from an infected person's cough or sneeze. You can also catch the virus by touching something that was recently contaminated with the virus and then touching your mouth, nose, or eyes. RISKS AND COMPLICATIONS You may be at risk for a more severe case of influenza if you smoke cigarettes, have diabetes, have chronic heart disease (such as heart failure) or lung disease (such as asthma), or if you have a weakened immune system. Elderly people and pregnant women are also at risk for more serious infections. The most common problem of influenza is a lung infection (pneumonia). Sometimes, this problem can require emergency medical care and may be life threatening. SIGNS AND SYMPTOMS  Symptoms typically last 4 to 10 days and may include:  Fever.  Chills.  Headache, body aches, and muscle aches.  Sore throat.  Chest discomfort and cough.  Poor appetite.  Weakness or feeling tired.  Dizziness.  Nausea or vomiting. DIAGNOSIS  Diagnosis of influenza is often made based on your history and a physical exam. A nose or throat swab test can be done to confirm the diagnosis. TREATMENT  In mild cases, influenza goes away on its own. Treatment is directed at relieving symptoms. For more severe cases, your health care provider may prescribe antiviral medicines to shorten the sickness. Antibiotic medicines are not effective because the infection is caused by a virus, not by bacteria. HOME CARE INSTRUCTIONS  Take medicines only as directed by your health care provider.  Use a cool mist humidifier to make breathing easier.  Get plenty of rest until your temperature returns to normal. This usually takes 3 to 4 days.  Drink enough fluid to keep your urine clear or pale yellow.  Cover yourmouth and nosewhen coughing or sneezing,and wash your handswellto prevent thevirusfrom spreading.  Stay homefromwork  orschool untilthe fever is gonefor at least 31full day. PREVENTION  An annual influenza vaccination (flu shot) is the best way to avoid getting influenza. An annual flu shot is now routinely recommended for all adults in the [place] IF:  You experiencechest pain, yourcough worsens,or you producemore mucus.  Youhave nausea,vomiting, ordiarrhea.  Your fever returns or gets worse. SEEK IMMEDIATE MEDICAL CARE IF:  You havetrouble breathing, you become short of breath,or your skin ornails becomebluish.  You have severe painor stiffnessin the neck.  You develop a sudden headache, or pain in the face or ear.  You have nausea or vomiting that you cannot control. MAKE SURE YOU:   Understand these instructions.  Will watch your condition.  Will get help right away if you are not doing well or get worse.   This information is not intended to replace advice given to you by your health care provider. Make sure you discuss any questions you have with your health care provider.   Document Released: 07/11/2000 Document Revised: 08/04/2014 Document Reviewed: 10/13/2011 Elsevier Interactive Patient Education [employer].

## 2015-10-19 LAB — CULTURE, GROUP A STREP (THRC)

## 2016-01-24 ENCOUNTER — Encounter (HOSPITAL_COMMUNITY): Payer: Self-pay | Admitting: Emergency Medicine

## 2016-01-24 ENCOUNTER — Ambulatory Visit (HOSPITAL_COMMUNITY)
Admission: EM | Admit: 2016-01-24 | Discharge: 2016-01-24 | Disposition: A | Discharge status: Home / Self Care | Payer: Medicaid Other | Attending: Emergency Medicine | Admitting: Emergency Medicine

## 2016-01-24 DIAGNOSIS — N898 Other specified noninflammatory disorders of vagina: Secondary | ICD-10-CM | POA: Diagnosis not present

## 2016-01-24 DIAGNOSIS — Z9889 Other specified postprocedural states: Secondary | ICD-10-CM | POA: Diagnosis not present

## 2016-01-24 DIAGNOSIS — N760 Acute vaginitis: Secondary | ICD-10-CM | POA: Insufficient documentation

## 2016-01-24 DIAGNOSIS — Z79899 Other long term (current) drug therapy: Secondary | ICD-10-CM | POA: Diagnosis not present

## 2016-01-24 DIAGNOSIS — I10 Essential (primary) hypertension: Secondary | ICD-10-CM | POA: Insufficient documentation

## 2016-01-24 DIAGNOSIS — J45909 Unspecified asthma, uncomplicated: Secondary | ICD-10-CM | POA: Insufficient documentation

## 2016-01-24 LAB — POCT PREGNANCY, URINE: Preg Test, Ur: NEGATIVE

## 2016-01-24 LAB — POCT URINALYSIS DIP (DEVICE)
Bilirubin Urine: NEGATIVE
Glucose, UA: NEGATIVE mg/dL
Hgb urine dipstick: NEGATIVE
KETONES UR: NEGATIVE mg/dL
Leukocytes, UA: NEGATIVE
Nitrite: NEGATIVE
PH: 7 (ref 5.0–8.0)
Protein, ur: NEGATIVE mg/dL
SPECIFIC GRAVITY, URINE: 1.02 (ref 1.005–1.030)
UROBILINOGEN UA: 0.2 mg/dL (ref 0.0–1.0)

## 2016-01-24 MED ORDER — METRONIDAZOLE 500 MG PO TABS: 500.0000 mg | ORAL_TABLET | Freq: Two times a day (BID) | ORAL | Status: DC

## 2016-01-24 NOTE — ED Notes (Signed)
Patient reports having a child at the end of 2016.  First depo shot was in November 2016.  She did not go back for next injection.  Patient reports she did not have a period from February 2017 until may 2017.  In June did have a period described as light and has an odor and had lower abdominal cramping.  Period lasted for 6 days.  Period left and odor has continued.

## 2016-01-24 NOTE — Discharge Instructions (Signed)

## 2016-01-24 NOTE — ED Provider Notes (Signed)
CSN: [id number]     Arrival date & time 01/24/16  1256 History   None    Chief Complaint  Patient presents with  . Vaginitis   (Consider location/radiation/quality/duration/timing/severity/associated sxs/prior Treatment) Patient is a 30 y.o. female presenting with vaginal discharge.  Vaginal Discharge Quality:  Yellow Severity:  Moderate Onset quality:  Gradual Duration:  2 weeks Timing:  Constant Progression:  Waxing and waning Chronicity:  New Relieved by:  Nothing Worsened by:  Nothing tried Ineffective treatments:  None tried Associated symptoms: no abdominal pain     Past Medical History  Diagnosis Date  . Hypertension     not currently on meds  . Eczema   . Asthma     managed with rescue inhaler only; last used 8 months ago  . Fibroid 2016    uterus  . Infection 2015    UTI; was treated  . Anemia    Past Surgical History  Procedure Laterality Date  . Therapeutic abortion    . Cesarean section  2006    classical vertical skin incision; transverse incision on uterus  . Cesarean section N/A 05/11/2015    Procedure: CESAREAN SECTION;  Surgeon: Frederico Hamman, MD;  Location: [facility];  Service: Obstetrics;  Laterality: N/A;   Family History  Problem Relation Age of Onset  . Adopted: Yes   Social History  Substance Use Topics  . Smoking status: Never Smoker   . Smokeless tobacco: Never Used  . Alcohol Use: No     Comment: occ   OB History    Gravida Para Term Preterm AB TAB SAB Ectopic Multiple Living   5 3 2 1 2 1 1  0 0 2     Review of Systems  Constitutional: Negative.   HENT: Negative.   Eyes: Negative.   Respiratory: Negative.   Cardiovascular: Negative.   Gastrointestinal: Negative.  Negative for abdominal pain.  Endocrine: Negative.   Genitourinary: Positive for vaginal discharge.  Skin: Negative.   Allergic/Immunologic: Negative.   Neurological: Negative.   Hematological: Negative.   Psychiatric/Behavioral: Negative.     Allergies   Review of patient's allergies indicates no known allergies.  Home Medications   Prior to Admission medications   Medication Sig Start Date End Date Taking? Authorizing Provider  metroNIDAZOLE (FLAGYL) 500 MG tablet Take 1 tablet (500 mg total) by mouth 2 (two) times daily. 01/24/16   Lysbeth Penner, FNP  ondansetron (ZOFRAN) 4 MG tablet Take 1 tablet (4 mg total) by mouth every 6 (six) hours. Patient not taking: Reported on 01/24/2016 10/16/15   Konrad Felix, PA   Meds Ordered and Administered this Visit  Medications - No data to display  BP 124/87 mmHg  Pulse 73  Temp(Src) 98.5 F (36.9 C) (Oral)  Resp 14  SpO2 96%  LMP 12/27/2015 No data found.   Physical Exam  Constitutional: She appears well-developed and well-nourished.  HENT:  Head: Normocephalic.  Right Ear: External ear normal.  Left Ear: External ear normal.  Mouth/Throat: Oropharynx is clear and moist.  Eyes: Conjunctivae are normal. Pupils are equal, round, and reactive to light.  Neck: Normal range of motion. Neck supple.  Cardiovascular: Normal rate, regular rhythm and normal heart sounds.   Pulmonary/Chest: Effort normal and breath sounds normal.  Abdominal: Soft. Bowel sounds are normal.  Genitourinary: Vaginal discharge found.  Cervix with parous OS w/o tenderness with speculum exam and yellow DC with fishy odor present.  BUS wnl    ED Course  Procedures (including critical care time)  Labs Review Labs Reviewed  POCT URINALYSIS DIP (DEVICE)  POCT PREGNANCY, URINE  CERVICOVAGINAL ANCILLARY ONLY    Imaging Review No results found.   Visual Acuity Review  Right Eye Distance:   Left Eye Distance:   Bilateral Distance:    Right Eye Near:   Left Eye Near:    Bilateral Near:         MDM   1. Vaginal discharge   2. Vaginitis    UA for GC/ chlyamydia and wet prep and trichomonas obtained. Explained to patient that I think this is BV and will tx empirically with Flagyl 500mg  one po  bid x 7 days #14    Lysbeth Penner, FNP 01/24/16 1409

## 2016-01-25 LAB — CERVICOVAGINAL ANCILLARY ONLY
Chlamydia: NEGATIVE
Neisseria Gonorrhea: NEGATIVE
Wet Prep (BD Affirm): POSITIVE — AB

## 2016-02-01 ENCOUNTER — Telehealth (HOSPITAL_COMMUNITY): Payer: Self-pay | Admitting: Emergency Medicine

## 2016-02-01 NOTE — ED Notes (Signed)
Called both numbers on file [phone] and [phone] but no answer and no VM Need to give lab results from recent visit on 6/29 Also let pt know labs can be obtained from [place]  Per Dr. Valere Dross,   Notes Recorded by Sherlene Shams, MD on 01/27/2016 at 9:11 AM Please let patient know that tests for gonorrhea/chlamydia were negative. Test for gardnerella (bacterial vaginosis) was positive; rx metronidazole was given at [facility] visit 01/24/16. Recheck or followup pcp/[facility] as needed if symptoms persist. LM

## 2016-03-06 ENCOUNTER — Encounter (HOSPITAL_COMMUNITY): Payer: Self-pay | Admitting: Emergency Medicine

## 2016-03-06 ENCOUNTER — Ambulatory Visit (HOSPITAL_COMMUNITY)
Admission: EM | Admit: 2016-03-06 | Discharge: 2016-03-06 | Disposition: A | Discharge status: Home / Self Care | Payer: Self-pay | Attending: Family Medicine | Admitting: Family Medicine

## 2016-03-06 DIAGNOSIS — Z711 Person with feared health complaint in whom no diagnosis is made: Secondary | ICD-10-CM

## 2016-03-06 DIAGNOSIS — N760 Acute vaginitis: Secondary | ICD-10-CM

## 2016-03-06 DIAGNOSIS — B9689 Other specified bacterial agents as the cause of diseases classified elsewhere: Secondary | ICD-10-CM

## 2016-03-06 DIAGNOSIS — N898 Other specified noninflammatory disorders of vagina: Secondary | ICD-10-CM

## 2016-03-06 DIAGNOSIS — A499 Bacterial infection, unspecified: Secondary | ICD-10-CM

## 2016-03-06 LAB — POCT URINALYSIS DIP (DEVICE)
Bilirubin Urine: NEGATIVE
GLUCOSE, UA: NEGATIVE mg/dL
Hgb urine dipstick: NEGATIVE
KETONES UR: NEGATIVE mg/dL
NITRITE: NEGATIVE
PROTEIN: NEGATIVE mg/dL
Specific Gravity, Urine: 1.01 (ref 1.005–1.030)
Urobilinogen, UA: 0.2 mg/dL (ref 0.0–1.0)
pH: 7 (ref 5.0–8.0)

## 2016-03-06 MED ORDER — METRONIDAZOLE 500 MG PO TABS: 500.0000 mg | ORAL_TABLET | Freq: Two times a day (BID) | ORAL | 0 refills | Status: DC

## 2016-03-06 NOTE — ED Provider Notes (Signed)
CSN: [id number]     Arrival date & time 03/06/16  1504 History   First MD Initiated Contact with Patient 03/06/16 1617     Chief Complaint  Patient presents with  . Vaginal Discharge   (Consider location/radiation/quality/duration/timing/severity/associated sxs/prior Treatment) 30 year old female presents to the urgent care complaining of vaginal discharge without odor for 2 days. Denies pelvic pain. Has a history of frequent episodes of BV and other vaginal disorders. Frequent visits to the ED and urgent care. She was at the urgent care at the end of June 2017 in tested for STDs. She was negative for GC and chlamydia and positive for Gardnerella.      Past Medical History:  Diagnosis Date  . Anemia   . Asthma    managed with rescue inhaler only; last used 8 months ago  . Eczema   . Fibroid 2016   uterus  . Hypertension    not currently on meds  . Infection 2015   UTI; was treated   Past Surgical History:  Procedure Laterality Date  . CESAREAN SECTION  2006   classical vertical skin incision; transverse incision on uterus  . CESAREAN SECTION N/A 05/11/2015   Procedure: CESAREAN SECTION;  Surgeon: Frederico Hamman, MD;  Location: [facility];  Service: Obstetrics;  Laterality: N/A;  . THERAPEUTIC ABORTION     Family History  Problem Relation Age of Onset  . Adopted: Yes   Social History  Substance Use Topics  . Smoking status: Never Smoker  . Smokeless tobacco: Never Used  . Alcohol use No     Comment: occ   OB History    Gravida Para Term Preterm AB Living   5 3 2 1 2 2    SAB TAB Ectopic Multiple Live Births   1 1 0 0 3     Review of Systems  Constitutional: Negative.   HENT: Negative.   Respiratory: Negative.   Gastrointestinal: Negative.   Genitourinary: Positive for vaginal discharge. Negative for dysuria and pelvic pain.  Skin: Negative.     Allergies  Review of patient's allergies indicates no known allergies.  Home Medications   Prior to Admission  medications   Medication Sig Start Date End Date Taking? Authorizing Provider  metroNIDAZOLE (FLAGYL) 500 MG tablet Take 1 tablet (500 mg total) by mouth 2 (two) times daily. X 7 days 03/06/16   Janne Napoleon, NP  ondansetron (ZOFRAN) 4 MG tablet Take 1 tablet (4 mg total) by mouth every 6 (six) hours. Patient not taking: Reported on 01/24/2016 10/16/15   Konrad Felix, PA   Meds Ordered and Administered this Visit  Medications - No data to display  LMP 02/26/2016  No data found.   Physical Exam  Constitutional: She is oriented to person, place, and time. She appears well-developed and well-nourished.  Neck: Neck supple.  Cardiovascular: Normal rate.   Pulmonary/Chest: Effort normal.  Genitourinary:  Genitourinary Comments: Normal external female genitalia. Vagina and cervix is coated with a thick green discharge in copious amount. Cervix is just right of midline. Green discharge exuding from the os.  Neurological: She is alert and oriented to person, place, and time.  Skin: Skin is warm and dry.  Nursing note and vitals reviewed.   Urgent Care Course   Clinical Course    Procedures (including critical care time)  Labs Review Labs Reviewed  POCT URINALYSIS DIP (DEVICE) - Abnormal; Notable for the following:       Result Value   Leukocytes, UA SMALL (*)  All other components within normal limits  URINE CULTURE  HIV ANTIBODY (ROUTINE TESTING)  CERVICOVAGINAL ANCILLARY ONLY    Imaging Review No results found.   Visual Acuity Review  Right Eye Distance:   Left Eye Distance:   Bilateral Distance:    Right Eye Near:   Left Eye Near:    Bilateral Near:         MDM   1. BV (bacterial vaginosis)   2. Vaginitis   3. Vaginal discharge   4. Concern about STD in female without diagnosis    Urine culture, cervical cytology and HIV pending Flagyl 500 as dir. F/U with your PCP    Janne Napoleon, NP 03/06/16 1733

## 2016-03-06 NOTE — ED Notes (Signed)
Patient pacing in doorway, assured provider will be into see her.  Patient remains unable to provide urine specimen.

## 2016-03-06 NOTE — ED Triage Notes (Signed)
Patient seen recently for the same symptoms.  Reports she did take medicine, but skipped one day and says she doubled-up on dose.  Patient reports when she finished this menstrual cycle noticed the same discharge and odor.  Patient reports this is a recurrent issue for her.  Patient admits to having to burning with urination.    Discussed the benefits of having a ob/gyn, someone to follow your condition, use latest treatment modalities and possibly treat without repeated office visits.

## 2016-03-07 LAB — CERVICOVAGINAL ANCILLARY ONLY
Chlamydia: NEGATIVE
Neisseria Gonorrhea: NEGATIVE
Wet Prep (BD Affirm): POSITIVE — AB

## 2016-03-07 LAB — HIV ANTIBODY (ROUTINE TESTING W REFLEX): HIV SCREEN 4TH GENERATION: NONREACTIVE

## 2016-03-12 ENCOUNTER — Telehealth (HOSPITAL_COMMUNITY): Payer: Self-pay | Admitting: Emergency Medicine

## 2016-03-12 NOTE — Telephone Encounter (Signed)
Per Dr. Valere Dross,  Notes Recorded by Sherlene Shams, MD on 03/08/2016 at 11:30 AM EDT Please let patient know that test for gardnerella (bacterial vaginosis) was positive. Rx metronidazole was given at [facility] visit 03/06/16. Recheck or followup PCP as needed if not improving. LM  Called both numbers on file ([phone] and [phone]) but no answer.  Need to give lab results from recent visit on 8/10 Also let pt know labs can be obtained from [place]

## 2016-04-14 ENCOUNTER — Encounter (HOSPITAL_COMMUNITY): Payer: Self-pay

## 2016-04-14 ENCOUNTER — Inpatient Hospital Stay (HOSPITAL_COMMUNITY)
Admission: AD | Admit: 2016-04-14 | Discharge: 2016-04-14 | Disposition: A | Discharge status: Home / Self Care | Payer: Medicaid Other | Source: Ambulatory Visit | Attending: Family Medicine | Admitting: Family Medicine

## 2016-04-14 DIAGNOSIS — N9489 Other specified conditions associated with female genital organs and menstrual cycle: Secondary | ICD-10-CM

## 2016-04-14 DIAGNOSIS — N898 Other specified noninflammatory disorders of vagina: Secondary | ICD-10-CM

## 2016-04-14 DIAGNOSIS — Z3202 Encounter for pregnancy test, result negative: Secondary | ICD-10-CM | POA: Insufficient documentation

## 2016-04-14 DIAGNOSIS — N390 Urinary tract infection, site not specified: Secondary | ICD-10-CM | POA: Insufficient documentation

## 2016-04-14 LAB — URINALYSIS, ROUTINE W REFLEX MICROSCOPIC
Bilirubin Urine: NEGATIVE
Glucose, UA: NEGATIVE mg/dL
Ketones, ur: NEGATIVE mg/dL
Nitrite: POSITIVE — AB
Protein, ur: NEGATIVE mg/dL
SPECIFIC GRAVITY, URINE: 1.01 (ref 1.005–1.030)
pH: 6 (ref 5.0–8.0)

## 2016-04-14 LAB — WET PREP, GENITAL
CLUE CELLS WET PREP: NONE SEEN
SPERM: NONE SEEN
Trich, Wet Prep: NONE SEEN
YEAST WET PREP: NONE SEEN

## 2016-04-14 LAB — POCT PREGNANCY, URINE: Preg Test, Ur: NEGATIVE

## 2016-04-14 LAB — URINE MICROSCOPIC-ADD ON

## 2016-04-14 MED ORDER — SULFAMETHOXAZOLE-TRIMETHOPRIM 800-160 MG PO TABS: 1.0000 | ORAL_TABLET | Freq: Two times a day (BID) | ORAL | 0 refills | Status: AC

## 2016-04-14 MED ORDER — METRONIDAZOLE 0.75 % VA GEL: 1.0000 | Freq: Every day | VAGINAL | 0 refills | Status: DC

## 2016-04-14 NOTE — MAU Provider Note (Signed)
History     CSN: [id number]  Arrival date and time: 04/14/16 1109   First Provider Initiated Contact with Patient 04/14/16 1156      Chief Complaint  Patient presents with  . Vaginal Discharge  . Dysuria   HPI   [name] is a 30 y.o. female [phone] here in MAU with odorous discharge and dysuria. Patient was treated last month for BV and completed the course of treatment. She just finished her menstrual cycle and feels the symptoms have returned.   OB History    Gravida Para Term Preterm AB Living   5 3 2 1 2 2    SAB TAB Ectopic Multiple Live Births   1 1 0 0 3      Past Medical History:  Diagnosis Date  . Asthma    managed with rescue inhaler only; last used 8 months ago  . Eczema   . Fibroid 2016   uterus  . Hypertension    not currently on meds  . Infection 2015   UTI; was treated    Past Surgical History:  Procedure Laterality Date  . CESAREAN SECTION  2006   classical vertical skin incision; transverse incision on uterus  . CESAREAN SECTION N/A 05/11/2015   Procedure: CESAREAN SECTION;  Surgeon: Frederico Hamman, MD;  Location: [facility];  Service: Obstetrics;  Laterality: N/A;  . THERAPEUTIC ABORTION      Family History  Problem Relation Age of Onset  . Adopted: Yes    Social History  Substance Use Topics  . Smoking status: Never Smoker  . Smokeless tobacco: Never Used  . Alcohol use No     Comment: occ    Allergies: No Known Allergies  Prescriptions Prior to Admission  Medication Sig Dispense Refill Last Dose  . albuterol (PROVENTIL HFA;VENTOLIN HFA) 108 (90 Base) MCG/ACT inhaler Inhale into the lungs every 6 (six) hours as needed for wheezing or shortness of breath.     . metroNIDAZOLE (FLAGYL) 500 MG tablet Take 1 tablet (500 mg total) by mouth 2 (two) times daily. X 7 days (Patient not taking: Reported on 04/14/2016) 14 tablet 0 Not Taking at Unknown time  . ondansetron (ZOFRAN) 4 MG tablet Take 1 tablet (4 mg total) by mouth every  6 (six) hours. (Patient not taking: Reported on 01/24/2016) 12 tablet 0 Unknown at Unknown time   Results for orders placed or performed during the hospital encounter of 04/14/16 (from the past 24 hour(s))  Urinalysis, Routine w reflex microscopic (not at [facility])     Status: Abnormal   Collection Time: 04/14/16 12:21 PM  Result Value Ref Range   Color, Urine YELLOW YELLOW   APPearance HAZY (A) CLEAR   Specific Gravity, Urine 1.010 1.005 - 1.030   pH 6.0 5.0 - 8.0   Glucose, UA NEGATIVE NEGATIVE mg/dL   Hgb urine dipstick TRACE (A) NEGATIVE   Bilirubin Urine NEGATIVE NEGATIVE   Ketones, ur NEGATIVE NEGATIVE mg/dL   Protein, ur NEGATIVE NEGATIVE mg/dL   Nitrite POSITIVE (A) NEGATIVE   Leukocytes, UA LARGE (A) NEGATIVE  Wet prep, genital     Status: Abnormal   Collection Time: 04/14/16 12:21 PM  Result Value Ref Range   Yeast Wet Prep HPF POC NONE SEEN NONE SEEN   Trich, Wet Prep NONE SEEN NONE SEEN   Clue Cells Wet Prep HPF POC NONE SEEN NONE SEEN   WBC, Wet Prep HPF POC FEW (A) NONE SEEN   Sperm NONE SEEN  Urine microscopic-add on     Status: Abnormal   Collection Time: 04/14/16 12:21 PM  Result Value Ref Range   Squamous Epithelial / LPF 0-5 (A) NONE SEEN   WBC, UA TOO NUMEROUS TO COUNT 0 - 5 WBC/hpf   RBC / HPF 0-5 0 - 5 RBC/hpf   Bacteria, UA FEW (A) NONE SEEN  Pregnancy, urine POC     Status: None   Collection Time: 04/14/16 12:33 PM  Result Value Ref Range   Preg Test, Ur NEGATIVE NEGATIVE    Review of Systems  Constitutional: Negative for chills and fever.  Gastrointestinal: Negative for abdominal pain.  Genitourinary: Positive for dysuria, frequency and urgency.   Physical Exam   Blood pressure 123/89, pulse 67, temperature 98.2 F (36.8 C), temperature source Oral, resp. rate 16, height 5\' 6"  (1.676 m), weight 120 lb (54.4 kg), last menstrual period 04/09/2016, unknown if currently breastfeeding.  Physical Exam  Constitutional: She is oriented to person, place,  and time. She appears well-developed and well-nourished. No distress.  HENT:  Head: Normocephalic.  Genitourinary:  Genitourinary Comments: Wet prep and GC collected without speculum by RN  Musculoskeletal: Normal range of motion.  Neurological: She is alert and oriented to person, place, and time.  Skin: Skin is warm. She is not diaphoretic.  Psychiatric: Her behavior is normal.    MAU Course  Procedures  None  MDM  Wet prep  GC  Assessment and Plan   A:  1. Vaginal odor   2. Acute UTI     P:  Discharge home in stable condition  Patient adamant that she has and odor which is related to BV. Patient requesting treatment today. Discussed proper use of antibiotics and possible need for desensitizing due to recurrent BV. This would need to be done with a GYN.  Discussed proper use of MAU.   Lezlie Lye, NP 04/14/2016 3:38 PM

## 2016-04-14 NOTE — MAU Provider Note (Signed)
S: Caitlin Greer is a 30 y.o. black female here in MAU with 1 wk history of thick, white vaginal discharge that is malodorous. Pt was treated last month for BV and completed her course of metronidazole. She also endorses dysuria, frequency, and urgency. Pt denies fever, N/V, and diarrhea.  O:  Blood pressure 123/89, pulse 67, temperature 98.2 F (36.8 C), temperature source Oral, resp. rate 16, weight 120 lb (54.4 kg), SpO2 100 %.  Physical Exam Constitutional: She is oriented to person, place, and time. She appears well-developedand well-nourished. No distress.  Head: Normocephalic.  Eyes: Pupils are equal, round, and reactive to light.  Neck: Neck supple.  GI: Soft. Normal appearance. Mild suprapubic tenderness. There is no rigidity, no reboundand no guarding.  Musculoskeletal: Normal range of motion.  Neurological: She is alertand oriented to person, place, and time.  Skin: She is not diaphoretic.  A: --No acute BV infection --Acute uncomplicated UTI  P:  --Discharge home in stable condition --Begin metronidazole gel for symptomatic relief --Advised to see gyn outpatient provider for chronic BV care --Bactrim x3 days for uncomplicated UTI

## 2016-04-14 NOTE — MAU Note (Signed)
Pt C/O white discharge since 9/11, has odor, also burning.  Just finished period yesterday.  Denies abd pain.  Had BV earlier this month, finished the medication.  Has some burning with urination.

## 2016-04-14 NOTE — Discharge Instructions (Signed)

## 2016-04-15 LAB — GC/CHLAMYDIA PROBE AMP (~~LOC~~) NOT AT ARMC
CHLAMYDIA, DNA PROBE: NEGATIVE
Neisseria Gonorrhea: NEGATIVE

## 2016-04-23 LAB — PROCEDURE REPORT - SCANNED: PAP SMEAR: NEGATIVE

## 2016-05-17 DIAGNOSIS — J069 Acute upper respiratory infection, unspecified: Secondary | ICD-10-CM | POA: Insufficient documentation

## 2016-05-17 DIAGNOSIS — I10 Essential (primary) hypertension: Secondary | ICD-10-CM | POA: Insufficient documentation

## 2016-05-17 DIAGNOSIS — J45909 Unspecified asthma, uncomplicated: Secondary | ICD-10-CM | POA: Insufficient documentation

## 2016-05-17 NOTE — ED Triage Notes (Signed)
Patient arrives with complaint of cough, copious mucus, head pain, and bilateral ear pain. Describes mucus as green. States one instance of fever about 1 week ago when symptoms began.

## 2016-05-18 ENCOUNTER — Emergency Department (HOSPITAL_COMMUNITY)
Admission: EM | Admit: 2016-05-18 | Discharge: 2016-05-18 | Disposition: A | Discharge status: Home / Self Care | Payer: Medicaid Other | Attending: Emergency Medicine | Admitting: Emergency Medicine

## 2016-05-18 ENCOUNTER — Encounter (HOSPITAL_COMMUNITY): Payer: Self-pay | Admitting: Emergency Medicine

## 2016-05-18 DIAGNOSIS — R0981 Nasal congestion: Secondary | ICD-10-CM

## 2016-05-18 DIAGNOSIS — J069 Acute upper respiratory infection, unspecified: Secondary | ICD-10-CM

## 2016-05-18 MED ORDER — AMOXICILLIN-POT CLAVULANATE 875-125 MG PO TABS
1.0000 | ORAL_TABLET | Freq: Once | ORAL | Status: AC
  Administered 2016-05-18: 1 via ORAL
  Filled 2016-05-18: qty 1

## 2016-05-18 MED ORDER — CETIRIZINE HCL 10 MG PO TABS: 10.0000 mg | ORAL_TABLET | Freq: Every day | ORAL | 1 refills | Status: DC

## 2016-05-18 MED ORDER — BENZONATATE 100 MG PO CAPS: 200.0000 mg | ORAL_CAPSULE | Freq: Two times a day (BID) | ORAL | 0 refills | Status: DC | PRN

## 2016-05-18 MED ORDER — AMOXICILLIN-POT CLAVULANATE 875-125 MG PO TABS: 1.0000 | ORAL_TABLET | Freq: Two times a day (BID) | ORAL | 0 refills | Status: DC

## 2016-05-18 NOTE — ED Provider Notes (Signed)
[facility] DEPT Provider Note   CSN: [id number] Arrival date & time: 05/17/16  2341     History   Chief Complaint Chief Complaint  Patient presents with  . Nasal Congestion  . Headache    HPI Caitlin Greer is a 30 y.o. female.  Patient presents to the ED with a chief complaint of sinus congestion and headache for the past week.  She states that she has noticed increased sinus pressure and ear fullness.  She states that she did run a fever earlier in the week.  She reports associated sore throat, and mild productive cough.  She has a history of asthma, but denies any exacerbations recently.  She has an inhaler at home, but hasn't had to use it more than usual.  She has tried taking OTC meds for her symptoms with some relief.  She comes to the ER tonight over concern about the length of illness.   The history is provided by the patient. No language interpreter was used.    Past Medical History:  Diagnosis Date  . Asthma    managed with rescue inhaler only; last used 8 months ago  . Eczema   . Fibroid 2016   uterus  . Hypertension    not currently on meds  . Infection 2015   UTI; was treated    Patient Active Problem List   Diagnosis Date Noted  . S/P cesarean section 05/11/2015  . Preterm labor 04/26/2015  . NVD (normal vaginal delivery) 06/08/2014  . Active bleeding 06/07/2014    Past Surgical History:  Procedure Laterality Date  . CESAREAN SECTION  2006   classical vertical skin incision; transverse incision on uterus  . CESAREAN SECTION N/A 05/11/2015   Procedure: CESAREAN SECTION;  Surgeon: Frederico Hamman, MD;  Location: [facility];  Service: Obstetrics;  Laterality: N/A;  . THERAPEUTIC ABORTION      OB History    Gravida Para Term Preterm AB Living   5 3 2 1 2 2    SAB TAB Ectopic Multiple Live Births   1 1 0 0 3       Home Medications    Prior to Admission medications   Medication Sig Start Date End Date Taking? Authorizing Provider    albuterol (PROVENTIL HFA;VENTOLIN HFA) 108 (90 Base) MCG/ACT inhaler Inhale into the lungs every 6 (six) hours as needed for wheezing or shortness of breath.    Historical Provider, MD  metroNIDAZOLE (METROGEL VAGINAL) 0.75 % vaginal gel Place 1 Applicatorful vaginally at bedtime. 04/14/16   Lezlie Lye, NP    Family History Family History  Problem Relation Age of Onset  . Adopted: Yes    Social History Social History  Substance Use Topics  . Smoking status: Never Smoker  . Smokeless tobacco: Never Used  . Alcohol use No     Comment: occ     Allergies   Review of patient's allergies indicates no known allergies.   Review of Systems Review of Systems  Constitutional: Positive for chills and fever.  HENT: Positive for postnasal drip, rhinorrhea, sinus pressure, sneezing and sore throat.   Respiratory: Positive for cough. Negative for shortness of breath.   Cardiovascular: Negative for chest pain.  Gastrointestinal: Negative for abdominal pain, constipation, diarrhea, nausea and vomiting.  Genitourinary: Negative for dysuria.     Physical Exam Updated Vital Signs BP 134/91 (BP Location: Right Arm)   Pulse 63   Temp 98.2 F (36.8 C) (Oral)   Resp 18  Ht 5\' 7"  (1.702 m)   Wt 61.2 kg   LMP 04/27/2016 (Exact Date)   SpO2 100%   Breastfeeding? No   BMI 21.14 kg/m   Physical Exam  Constitutional: She is oriented to person, place, and time. She appears well-developed and well-nourished.  HENT:  Head: Normocephalic and atraumatic.  Right Ear: External ear normal.  Left Ear: External ear normal.  Mouth/Throat: Oropharynx is clear and moist. No oropharyngeal exudate.  Swollen, erythematous turbinates, maxillary sinuses tender to palpation Bilateral TMs are congested with left sided erythema  Eyes: Conjunctivae and EOM are normal. Pupils are equal, round, and reactive to light.  Neck: Normal range of motion. Neck supple.  Cardiovascular: Normal rate, regular  rhythm and normal heart sounds.   Pulmonary/Chest: Effort normal and breath sounds normal. No respiratory distress. She has no wheezes. She has no rales. She exhibits no tenderness.  CTAB  Abdominal: Soft. Bowel sounds are normal.  Musculoskeletal: Normal range of motion.  Neurological: She is alert and oriented to person, place, and time.  Skin: Skin is warm and dry.  Psychiatric: She has a normal mood and affect. Her behavior is normal. Judgment and thought content normal.  Nursing note and vitals reviewed.    ED Treatments / Results  Labs (all labs ordered are listed, but only abnormal results are displayed) Labs Reviewed - No data to display  EKG  EKG Interpretation None       Radiology No results found.  Procedures Procedures (including critical care time)  Medications Ordered in ED Medications  amoxicillin-clavulanate (AUGMENTIN) 875-125 MG per tablet 1 tablet (not administered)     Initial Impression / Assessment and Plan / ED Course  I have reviewed the triage vital signs and the nursing notes.  Pertinent labs & imaging results that were available during my care of the patient were reviewed by me and considered in my medical decision making (see chart for details).  Clinical Course    Patient with sinus congestion and headache x 1 week.  Intermittent fever throughout the week.  Significant sinus pressure.  VSS.  Will cover with augmentin.  Recommend PCP follow-up.  Will also give zyrtec and tessalon.  Final Clinical Impressions(s) / ED Diagnoses   Final diagnoses:  Sinus congestion  Upper respiratory tract infection, unspecified type    New Prescriptions New Prescriptions   AMOXICILLIN-CLAVULANATE (AUGMENTIN) 875-125 MG TABLET    Take 1 tablet by mouth every 12 (twelve) hours.   BENZONATATE (TESSALON) 100 MG CAPSULE    Take 2 capsules (200 mg total) by mouth 2 (two) times daily as needed for cough.   CETIRIZINE (ZYRTEC ALLERGY) 10 MG TABLET    Take 1  tablet (10 mg total) by mouth daily.     Montine Circle, PA-C 05/18/16 0022    Merryl Hacker, MD 05/19/16 [phone]

## 2016-05-18 NOTE — ED Notes (Signed)
Pt understood dc material. NAD noted. Scripts given at dc 

## 2016-06-18 ENCOUNTER — Encounter (HOSPITAL_COMMUNITY): Payer: Self-pay | Admitting: *Deleted

## 2016-06-18 ENCOUNTER — Emergency Department (HOSPITAL_COMMUNITY): Payer: Self-pay

## 2016-06-18 ENCOUNTER — Emergency Department (HOSPITAL_COMMUNITY)
Admission: EM | Admit: 2016-06-18 | Discharge: 2016-06-19 | Disposition: A | Discharge status: Home / Self Care | Payer: Self-pay | Attending: Emergency Medicine | Admitting: Emergency Medicine

## 2016-06-18 DIAGNOSIS — D259 Leiomyoma of uterus, unspecified: Secondary | ICD-10-CM

## 2016-06-18 DIAGNOSIS — J45909 Unspecified asthma, uncomplicated: Secondary | ICD-10-CM | POA: Insufficient documentation

## 2016-06-18 DIAGNOSIS — Z79899 Other long term (current) drug therapy: Secondary | ICD-10-CM | POA: Insufficient documentation

## 2016-06-18 DIAGNOSIS — N83209 Unspecified ovarian cyst, unspecified side: Secondary | ICD-10-CM

## 2016-06-18 DIAGNOSIS — N83201 Unspecified ovarian cyst, right side: Secondary | ICD-10-CM | POA: Insufficient documentation

## 2016-06-18 DIAGNOSIS — R1033 Periumbilical pain: Secondary | ICD-10-CM

## 2016-06-18 DIAGNOSIS — I10 Essential (primary) hypertension: Secondary | ICD-10-CM | POA: Insufficient documentation

## 2016-06-18 LAB — COMPREHENSIVE METABOLIC PANEL
ALK PHOS: 49 U/L (ref 38–126)
ALT: 18 U/L (ref 14–54)
AST: 22 U/L (ref 15–41)
Albumin: 4.8 g/dL (ref 3.5–5.0)
Anion gap: 9 (ref 5–15)
BUN: 10 mg/dL (ref 6–20)
CALCIUM: 9 mg/dL (ref 8.9–10.3)
CHLORIDE: 106 mmol/L (ref 101–111)
CO2: 23 mmol/L (ref 22–32)
CREATININE: 0.75 mg/dL (ref 0.44–1.00)
Glucose, Bld: 83 mg/dL (ref 65–99)
Potassium: 3.1 mmol/L — ABNORMAL LOW (ref 3.5–5.1)
Sodium: 138 mmol/L (ref 135–145)
Total Bilirubin: 0.8 mg/dL (ref 0.3–1.2)
Total Protein: 8.3 g/dL — ABNORMAL HIGH (ref 6.5–8.1)

## 2016-06-18 LAB — URINALYSIS, ROUTINE W REFLEX MICROSCOPIC
Bilirubin Urine: NEGATIVE
GLUCOSE, UA: NEGATIVE mg/dL
HGB URINE DIPSTICK: NEGATIVE
KETONES UR: NEGATIVE mg/dL
LEUKOCYTES UA: NEGATIVE
Nitrite: NEGATIVE
PROTEIN: NEGATIVE mg/dL
Specific Gravity, Urine: 1.017 (ref 1.005–1.030)
pH: 8 (ref 5.0–8.0)

## 2016-06-18 LAB — CBC
HCT: 39.6 % (ref 36.0–46.0)
Hemoglobin: 13.2 g/dL (ref 12.0–15.0)
MCH: 30.4 pg (ref 26.0–34.0)
MCHC: 33.3 g/dL (ref 30.0–36.0)
MCV: 91.2 fL (ref 78.0–100.0)
PLATELETS: 229 10*3/uL (ref 150–400)
RBC: 4.34 MIL/uL (ref 3.87–5.11)
RDW: 12.7 % (ref 11.5–15.5)
WBC: 6 10*3/uL (ref 4.0–10.5)

## 2016-06-18 LAB — I-STAT BETA HCG BLOOD, ED (MC, WL, AP ONLY)

## 2016-06-18 LAB — LIPASE, BLOOD: Lipase: 23 U/L (ref 11–51)

## 2016-06-18 MED ORDER — IOPAMIDOL (ISOVUE-300) INJECTION 61%
100.0000 mL | Freq: Once | INTRAVENOUS | Status: AC | PRN
  Administered 2016-06-18: 100 mL via INTRAVENOUS

## 2016-06-18 MED ORDER — MORPHINE SULFATE (PF) 4 MG/ML IV SOLN
4.0000 mg | Freq: Once | INTRAVENOUS | Status: AC
Start: 2016-06-18 — End: 2016-06-18
  Administered 2016-06-18: 4 mg via INTRAVENOUS
  Filled 2016-06-18: qty 1

## 2016-06-18 MED ORDER — IOPAMIDOL (ISOVUE-300) INJECTION 61%
INTRAVENOUS | Status: AC
  Filled 2016-06-18: qty 100

## 2016-06-18 MED ORDER — SODIUM CHLORIDE 0.9 % IV BOLUS (SEPSIS)
1000.0000 mL | Freq: Once | INTRAVENOUS | Status: AC
  Administered 2016-06-18: 1000 mL via INTRAVENOUS

## 2016-06-18 MED ORDER — SODIUM CHLORIDE 0.9 % IJ SOLN
INTRAMUSCULAR | Status: AC
  Filled 2016-06-18: qty 50

## 2016-06-18 NOTE — ED Provider Notes (Signed)
[facility] DEPT Provider Note   CSN: [id number] Arrival date & time: 06/18/16  2055     History   Chief Complaint Chief Complaint  Patient presents with  . Abdominal Pain    HPI Caitlin Greer is a 30 y.o. female.  HPI Caitlin Greer is a 30 y.o. female with PMH significant for asthma, HTN, and fibroids who presents with sudden onset, constant, severe generalized abdominal pain that began 1.5 hours ago while driving to the grocery store.  She describes it as sharp and non-radiating.  She denies fever, chills, N/V/D/C, bloody stools, urinary symptoms, or vaginal bleeding/discharge.  She has not taken anything for her symptoms.  Movement makes her pain worse.  Nothing makes it better. Prior abdominal surgery includes c-section. Last BM this morning and was normal.    Past Medical History:  Diagnosis Date  . Asthma    managed with rescue inhaler only; last used 8 months ago  . Eczema   . Fibroid 2016   uterus  . Hypertension    not currently on meds  . Infection 2015   UTI; was treated    Patient Active Problem List   Diagnosis Date Noted  . S/P cesarean section 05/11/2015  . Preterm labor 04/26/2015  . NVD (normal vaginal delivery) 06/08/2014  . Active bleeding 06/07/2014    Past Surgical History:  Procedure Laterality Date  . CESAREAN SECTION  2006   classical vertical skin incision; transverse incision on uterus  . CESAREAN SECTION N/A 05/11/2015   Procedure: CESAREAN SECTION;  Surgeon: Frederico Hamman, MD;  Location: [facility];  Service: Obstetrics;  Laterality: N/A;  . THERAPEUTIC ABORTION      OB History    Gravida Para Term Preterm AB Living   5 3 2 1 2 2    SAB TAB Ectopic Multiple Live Births   1 1 0 0 3       Home Medications    Prior to Admission medications   Medication Sig Start Date End Date Taking? Authorizing Provider  albuterol (PROVENTIL HFA;VENTOLIN HFA) 108 (90 Base) MCG/ACT inhaler Inhale into the lungs every 6 (six) hours as  needed for wheezing or shortness of breath.   Yes Historical Provider, MD  amoxicillin-clavulanate (AUGMENTIN) 875-125 MG tablet Take 1 tablet by mouth every 12 (twelve) hours. Patient not taking: Reported on 06/18/2016 05/18/16   Montine Circle, PA-C  benzonatate (TESSALON) 100 MG capsule Take 2 capsules (200 mg total) by mouth 2 (two) times daily as needed for cough. Patient not taking: Reported on 06/18/2016 05/18/16   Montine Circle, PA-C  cetirizine (ZYRTEC ALLERGY) 10 MG tablet Take 1 tablet (10 mg total) by mouth daily. Patient not taking: Reported on 06/18/2016 05/18/16   Montine Circle, PA-C  metroNIDAZOLE (METROGEL VAGINAL) 0.75 % vaginal gel Place 1 Applicatorful vaginally at bedtime. Patient not taking: Reported on 06/18/2016 04/14/16   Lezlie Lye, NP    Family History Family History  Problem Relation Age of Onset  . Adopted: Yes    Social History Social History  Substance Use Topics  . Smoking status: Never Smoker  . Smokeless tobacco: Never Used  . Alcohol use No     Comment: occ     Allergies   Patient has no known allergies.   Review of Systems Review of Systems All other systems negative unless otherwise stated in HPI   Physical Exam Updated Vital Signs BP 115/78   Pulse 80   Temp 97.5 F (36.4 C) (Oral)  Resp 18   LMP 06/09/2016   SpO2 100%   Physical Exam  Constitutional: She is oriented to person, place, and time. She appears well-developed and well-nourished.  Non-toxic appearance. She does not have a sickly appearance. She does not appear ill.  Patient moaning and rolling around on exam bed.   HENT:  Head: Normocephalic and atraumatic.  Mouth/Throat: Oropharynx is clear and moist.  Eyes: Conjunctivae are normal. Pupils are equal, round, and reactive to light.  Neck: Normal range of motion. Neck supple.  Cardiovascular: Normal rate and regular rhythm.   Pulmonary/Chest: Effort normal and breath sounds normal. No accessory muscle  usage or stridor. No respiratory distress. She has no wheezes. She has no rhonchi. She has no rales.  Abdominal: Soft. Bowel sounds are normal. She exhibits no distension. There is generalized tenderness. There is guarding (voluntary). There is no rigidity and no rebound.  Genitourinary:  Genitourinary Comments: Chaperone present during exam. Cervical os closed. Cervix appears normal. No CMT or adnexal tenderness. No vaginal discharge or bleeding.  Musculoskeletal: Normal range of motion.  Lymphadenopathy:    She has no cervical adenopathy.  Neurological: She is alert and oriented to person, place, and time.  Speech clear without dysarthria.  Skin: Skin is warm and dry.  Vertical healing surgical scar over suprapubic abdomen.   Psychiatric: She has a normal mood and affect. Her behavior is normal.     ED Treatments / Results  Labs (all labs ordered are listed, but only abnormal results are displayed) Labs Reviewed  WET PREP, GENITAL - Abnormal; Notable for the following:       Result Value   WBC, Wet Prep HPF POC MANY (*)    All other components within normal limits  COMPREHENSIVE METABOLIC PANEL - Abnormal; Notable for the following:    Potassium 3.1 (*)    Total Protein 8.3 (*)    All other components within normal limits  LIPASE, BLOOD  CBC  URINALYSIS, ROUTINE W REFLEX MICROSCOPIC (NOT AT [facility])  I-STAT BETA HCG BLOOD, ED ([facility], [facility], [facility] ONLY)    EKG  EKG Interpretation None       Radiology Ct Abdomen Pelvis W Contrast  Result Date: 06/18/2016 CLINICAL DATA:  Sudden onset epigastric abdominal pain 45 minutes ago. Pain radiating to the right upper quadrant. EXAM: CT ABDOMEN AND PELVIS WITH CONTRAST TECHNIQUE: Multidetector CT imaging of the abdomen and pelvis was performed using the standard protocol following bolus administration of intravenous contrast. CONTRAST:  194mL ISOVUE-300 IOPAMIDOL (ISOVUE-300) INJECTION 61% COMPARISON:  None. FINDINGS: Lower chest: Lung bases are  clear. Hepatobiliary: Circumscribed low-attenuation lesion in the lateral segment left lobe of liver likely represents a cyst or hemangioma. No other focal liver lesions. Gallbladder and bile ducts are unremarkable. Pancreas: Unremarkable. No pancreatic ductal dilatation or surrounding inflammatory changes. Spleen: Normal in size without focal abnormality. Adrenals/Urinary Tract: Adrenal glands are unremarkable. Kidneys are normal, without renal calculi, focal lesion, or hydronephrosis. Bladder is unremarkable. Stomach/Bowel: Stomach, small bowel, and colon are not abnormally distended. Stool fills the colon. No wall thickening appreciated. Appendix is normal. Vascular/Lymphatic: No significant vascular findings are present. No enlarged abdominal or pelvic lymph nodes. Reproductive: Uterus is anteverted without enlargement. Right ovarian cyst with peripheral calcification measuring 3.6 x 4.7 cm. Increased density suggesting hemorrhagic cyst. Small amount of free fluid in the pelvis, tracking along the right pericolic gutter to the inferior liver edge. This likely is resulting from rupture of the cyst. Consider ultrasound for correlation. Other: No  free air in the abdomen. Abdominal wall musculature appears intact. Musculoskeletal: No acute or significant osseous findings. IMPRESSION: Complex cyst in the right ovary measuring up to 4.7 cm diameter likely representing a hemorrhagic cyst. Free fluid in the pelvis and along the right pericolic gutter is likely resulting from rupture of a cyst. Consider ultrasound correlation. No evidence of bowel obstruction or inflammation. Appendix is normal. Electronically Signed   By: Lucienne Capers M.D.   On: 06/18/2016 22:51    Procedures Procedures (including critical care time)  Medications Ordered in ED Medications  iopamidol (ISOVUE-300) 61 % injection (not administered)  sodium chloride 0.9 % injection (not administered)  potassium chloride SA (K-DUR,KLOR-CON) CR  tablet 40 mEq (not administered)  morphine 4 MG/ML injection 4 mg (4 mg Intravenous Given 06/18/16 2215)  sodium chloride 0.9 % bolus 1,000 mL (0 mLs Intravenous Stopped 06/19/16 0018)  iopamidol (ISOVUE-300) 61 % injection 100 mL (100 mLs Intravenous Contrast Given 06/18/16 2229)     Initial Impression / Assessment and Plan / ED Course  I have reviewed the triage vital signs and the nursing notes.  Pertinent labs & imaging results that were available during my care of the patient were reviewed by me and considered in my medical decision making (see chart for details).  Clinical Course    Patient presents with sudden onset abdominal pain. No other symptoms. Vitals reassuring. Pain controlled in ED with morphine. Labs show mild hypokalemia which was repleted in ED. Pelvic exam unremarkable. She refused STD testing. CT showed right ovarian cyst as well as free fluid likely secondary from a recently ruptured cyst. Pelvic ultrasound pending for further evaluation.  Anticipate discharge home with pain medicine and gynecology follow up.  Patient care hand off to oncoming provider, Charlann Lange, PA-C, at shift change who will follow up on ultrasound.   Final Clinical Impressions(s) / ED Diagnoses   Final diagnoses:  Ovarian cyst  Right ovarian cyst    New Prescriptions New Prescriptions   No medications on file     Vivien Rossetti 06/19/16 0124    Orlie Dakin, MD 06/19/16 0131

## 2016-06-18 NOTE — ED Notes (Signed)
Pt states she cannot give urine at this time. Will ask later.

## 2016-06-18 NOTE — ED Notes (Signed)
Pt is aware urine sample is needed, will call when she can give one.

## 2016-06-18 NOTE — ED Triage Notes (Signed)
Per [facility] personnel, pt had sudden onset of epigastric pain approximately 45 mins from this time. Pt states pain radiated to RUQ then entire upper body.

## 2016-06-18 NOTE — ED Notes (Signed)
Bed: WA22 Expected date:  Expected time:  Means of arrival:  Comments: EMS 

## 2016-06-19 ENCOUNTER — Emergency Department (HOSPITAL_COMMUNITY): Payer: Self-pay

## 2016-06-19 LAB — WET PREP, GENITAL
Clue Cells Wet Prep HPF POC: NONE SEEN
Sperm: NONE SEEN
Trich, Wet Prep: NONE SEEN
Yeast Wet Prep HPF POC: NONE SEEN

## 2016-06-19 MED ORDER — POTASSIUM CHLORIDE CRYS ER 20 MEQ PO TBCR
40.0000 meq | EXTENDED_RELEASE_TABLET | Freq: Once | ORAL | Status: AC
  Administered 2016-06-19: 40 meq via ORAL

## 2016-06-19 MED ORDER — NAPROXEN 500 MG PO TABS: 500.0000 mg | ORAL_TABLET | Freq: Two times a day (BID) | ORAL | 0 refills | Status: DC

## 2016-06-19 MED ORDER — HYDROCODONE-ACETAMINOPHEN 5-325 MG PO TABS: 1.0000 | ORAL_TABLET | ORAL | 0 refills | Status: DC | PRN

## 2016-06-19 NOTE — Discharge Instructions (Addendum)
You have a right ovarian cyst.  Your pain was likely due to an ovarian cyst rupture.  It is important that you follow up with gynecology for further management.  Return to the ED for sudden worsening pain, fever, nausea, vomiting, or any new or concerning symptoms.

## 2016-06-19 NOTE — ED Provider Notes (Signed)
Gen [facility] CT scan show complex right ovarian cyst, pelvic free fluid [place] pending to help disposition decision  Plan: Home with GYN referral anticipated.  [place] negative for cyst, positive for fibroid. Results and appropriate follow up discussed with the patient. Ok to discharge with outpatient referrals.    Charlann Lange, PA-C 06/19/16 [place], MD 06/20/16 [phone]

## 2016-06-19 NOTE — ED Provider Notes (Signed)
Complaint of sudden onset lower abdominal pain onset this afternoon. Pain has much subsided since here. On exam patient alert nontoxic abdomen with mild periumbilical tenderness no guarding rigidity or rebound   Orlie Dakin, MD 06/19/16 0111

## 2016-06-19 NOTE — ED Notes (Signed)
PT. REFUSED LAB DRAWS.RN,Holly made aware.

## 2016-06-19 NOTE — ED Notes (Signed)
PA aware of pt refusal for labdraw

## 2016-11-29 ENCOUNTER — Encounter (HOSPITAL_COMMUNITY): Payer: Self-pay

## 2016-11-29 ENCOUNTER — Emergency Department (HOSPITAL_COMMUNITY)
Admission: EM | Admit: 2016-11-29 | Discharge: 2016-11-29 | Disposition: A | Discharge status: Home / Self Care | Payer: Medicaid Other | Attending: Emergency Medicine | Admitting: Emergency Medicine

## 2016-11-29 DIAGNOSIS — Z79899 Other long term (current) drug therapy: Secondary | ICD-10-CM | POA: Insufficient documentation

## 2016-11-29 DIAGNOSIS — J45909 Unspecified asthma, uncomplicated: Secondary | ICD-10-CM | POA: Insufficient documentation

## 2016-11-29 DIAGNOSIS — H6691 Otitis media, unspecified, right ear: Secondary | ICD-10-CM | POA: Insufficient documentation

## 2016-11-29 MED ORDER — AMOXICILLIN 500 MG PO CAPS: 500.0000 mg | ORAL_CAPSULE | Freq: Three times a day (TID) | ORAL | 0 refills | Status: DC

## 2016-11-29 NOTE — ED Provider Notes (Signed)
[facility] DEPT Provider Note   CSN: [id number] Arrival date & time: 11/29/16  1802  By signing my name below, I, Higinio Plan, attest that this documentation has been prepared under the direction and in the presence of non-physician practitioner, Helen Hashimoto, PA-C. Electronically Signed: Higinio Plan, Scribe. 11/29/2016. 6:27 PM.  History   Chief Complaint Chief Complaint  Patient presents with  . Otalgia   The history is provided by the patient. No language interpreter was used.   HPI Comments: Caitlin Greer is a 31 y.o. female with PMHx of asthma, who presents to the Emergency Department complaining of gradually worsneing, right ear pain that began at ~4:00 AM this morning. Pt reports associated chills. She denies any fever, recent swimming, recent sickness, or hx of smoking.  Past Medical History:  Diagnosis Date  . Asthma    managed with rescue inhaler only; last used 8 months ago  . Eczema   . Fibroid 2016   uterus  . Infection 2015   UTI; was treated    Patient Active Problem List   Diagnosis Date Noted  . S/P cesarean section 05/11/2015  . Preterm labor 04/26/2015  . NVD (normal vaginal delivery) 06/08/2014  . Active bleeding 06/07/2014    Past Surgical History:  Procedure Laterality Date  . CESAREAN SECTION  2006   classical vertical skin incision; transverse incision on uterus  . CESAREAN SECTION N/A 05/11/2015   Procedure: CESAREAN SECTION;  Surgeon: Frederico Hamman, MD;  Location: [facility];  Service: Obstetrics;  Laterality: N/A;  . THERAPEUTIC ABORTION      OB History    Gravida Para Term Preterm AB Living   5 3 2 1 2 2    SAB TAB Ectopic Multiple Live Births   1 1 0 0 3     Home Medications    Prior to Admission medications   Medication Sig Start Date End Date Taking? Authorizing Provider  albuterol (PROVENTIL HFA;VENTOLIN HFA) 108 (90 Base) MCG/ACT inhaler Inhale into the lungs every 6 (six) hours as needed for wheezing or shortness of  breath.    [provider]  amoxicillin-clavulanate (AUGMENTIN) 875-125 MG tablet Take 1 tablet by mouth every 12 (twelve) hours. Patient not taking: Reported on 06/18/2016 05/18/16   Montine Circle, PA-C  benzonatate (TESSALON) 100 MG capsule Take 2 capsules (200 mg total) by mouth 2 (two) times daily as needed for cough. Patient not taking: Reported on 06/18/2016 05/18/16   Montine Circle, PA-C  cetirizine (ZYRTEC ALLERGY) 10 MG tablet Take 1 tablet (10 mg total) by mouth daily. Patient not taking: Reported on 06/18/2016 05/18/16   Montine Circle, PA-C  HYDROcodone-acetaminophen (NORCO/VICODIN) 5-325 MG tablet Take 1 tablet by mouth every 4 (four) hours as needed. 06/19/16   Charlann Lange, PA-C  metroNIDAZOLE (METROGEL VAGINAL) 0.75 % vaginal gel Place 1 Applicatorful vaginally at bedtime. Patient not taking: Reported on 06/18/2016 04/14/16   Rasch, Anderson Malta I, NP  naproxen (NAPROSYN) 500 MG tablet Take 1 tablet (500 mg total) by mouth 2 (two) times daily. 06/19/16   Charlann Lange, PA-C    Family History Family History  Problem Relation Age of Onset  . Adopted: Yes    Social History Social History  Substance Use Topics  . Smoking status: Never Smoker  . Smokeless tobacco: Never Used  . Alcohol use No     Comment: occ   Allergies   Patient has no known allergies.  Review of Systems Review of Systems  Constitutional: Positive for  chills. Negative for fever.  HENT: Positive for ear pain.    Physical Exam Updated Vital Signs BP (!) 122/96 (BP Location: Left Arm)   Pulse 72   Temp 98.3 F (36.8 C) (Oral)   Resp 18   SpO2 100%   Physical Exam  Constitutional: She is oriented to person, place, and time. She appears well-developed and well-nourished.  HENT:  Head: Normocephalic.  Left Ear: External ear normal.  Right TM is erythematous and bulging   Eyes: EOM are normal.  Neck: Normal range of motion.  Pulmonary/Chest: Effort normal.  Abdominal: She  exhibits no distension.  Musculoskeletal: Normal range of motion.  Neurological: She is alert and oriented to person, place, and time.  Psychiatric: She has a normal mood and affect.  Nursing note and vitals reviewed.  ED Treatments / Results  DIAGNOSTIC STUDIES:  Oxygen Saturation is 100% on RA, normal by my interpretation.    COORDINATION OF CARE:  6:14 PM Discussed treatment plan with pt at bedside and pt agreed to plan.  Labs (all labs ordered are listed, but only abnormal results are displayed) Labs Reviewed - No data to display  EKG  EKG Interpretation None       Radiology No results found.  Procedures Procedures (including critical care time)  Medications Ordered in ED Medications - No data to display  Initial Impression / Assessment and Plan / ED Course  I have reviewed the triage vital signs and the nursing notes.  Pertinent labs & imaging results that were available during my care of the patient were reviewed by me and considered in my medical decision making (see chart for details).     Patient presents with otalgia and exam consistent with acute otitis media. No concern for acute mastoiditis, meningitis.  Patient discharged home with Amoxicillin.  Advised patient to follow-up with PCP.  I have also discussed reasons to return immediately to the ER.  Patient expresses understanding and agrees with plan. Pt appears safe for discharge.   Final Clinical Impressions(s) / ED Diagnoses   Final diagnoses:  Right otitis media, unspecified otitis media type    New Prescriptions Discharge Medication List as of 11/29/2016  6:27 PM    START taking these medications   Details  amoxicillin (AMOXIL) 500 MG capsule Take 1 capsule (500 mg total) by mouth 3 (three) times daily., Starting Sat 11/29/2016, Print      An After Visit Summary was printed and given to the patient.  I personally performed the services in this documentation, which was scribed in my presence.   The recorded information has been reviewed and considered.   Ronnald Collum.   Sidney Ace 11/29/16 1936    Davonna Belling, MD 11/29/16 2308

## 2016-11-29 NOTE — ED Triage Notes (Signed)
Patient complains of right sided earache since 0400, no other associated symptoms

## 2016-11-29 NOTE — Discharge Instructions (Signed)
Return if any problems.

## 2016-11-29 NOTE — ED Notes (Signed)
Declined W/C at D/C and was escorted to lobby by RN. 

## 2017-07-14 ENCOUNTER — Emergency Department (HOSPITAL_COMMUNITY)
Admission: EM | Admit: 2017-07-14 | Discharge: 2017-07-14 | Disposition: A | Discharge status: Home / Self Care | Payer: Self-pay | Attending: Emergency Medicine | Admitting: Emergency Medicine

## 2017-07-14 ENCOUNTER — Encounter (HOSPITAL_COMMUNITY): Payer: Self-pay | Admitting: *Deleted

## 2017-07-14 DIAGNOSIS — Z79899 Other long term (current) drug therapy: Secondary | ICD-10-CM | POA: Insufficient documentation

## 2017-07-14 DIAGNOSIS — J45909 Unspecified asthma, uncomplicated: Secondary | ICD-10-CM | POA: Insufficient documentation

## 2017-07-14 DIAGNOSIS — J020 Streptococcal pharyngitis: Secondary | ICD-10-CM | POA: Insufficient documentation

## 2017-07-14 LAB — RAPID STREP SCREEN (MED CTR MEBANE ONLY): Streptococcus, Group A Screen (Direct): POSITIVE — AB

## 2017-07-14 MED ORDER — PREDNISONE 20 MG PO TABS: 40.0000 mg | ORAL_TABLET | Freq: Every day | ORAL | 0 refills | Status: AC

## 2017-07-14 MED ORDER — PENICILLIN G BENZATHINE 1200000 UNIT/2ML IM SUSP
1.2000 10*6.[IU] | Freq: Once | INTRAMUSCULAR | Status: AC
  Administered 2017-07-14: 1.2 10*6.[IU] via INTRAMUSCULAR
  Filled 2017-07-14: qty 2

## 2017-07-14 NOTE — ED Triage Notes (Signed)
Sore throat x 4 days, associated with fevers and nausea. No meds PTA

## 2017-07-14 NOTE — Discharge Instructions (Signed)
Your strep test was positive in the ER today.  You were treated with an antibiotic shot.    I have also written a prescription for prednisone which will help relieve the pain.  Please take 40 mg daily for the next 5 days.  You can take 600 mg ibuprofen every 6 hours as needed for pain.  Please avoid sharing drinks with your children or family members.  Please return to the emergency department if you have trouble breathing due to throat swelling or have any new or worsening symptoms.

## 2017-07-14 NOTE — ED Notes (Signed)
Pt departed in NAD.  

## 2017-07-14 NOTE — ED Provider Notes (Signed)
[facility] EMERGENCY DEPARTMENT Provider Note   CSN: [id number] Arrival date & time: 07/14/17  1900     History   Chief Complaint Chief Complaint  Patient presents with  . Sore Throat    HPI Caitlin Greer is a 31 y.o. female.  HPI  Caitlin Greer is a 31 year old female with a history of asthma who presents to the emergency department for evaluation of sore throat.  States that this began 4 days ago and has gradually worsened.  She reports that she has 8/10 severity, constant "sharp" throat pain.  Pain is worsened with swallowing.  She has not taken any over-the-counter medications for her symptoms.  Today she noticed that she had white spots on her tonsils.  Denies any sick contacts at home, although she does state that she shares drinks with her 2 young children.  She states that she had a tactile temperature earlier today.  Denies trismus, cough, nausea/vomiting, abdominal pain.  Is able to swallow without difficulty.  Past Medical History:  Diagnosis Date  . Asthma    managed with rescue inhaler only; last used 8 months ago  . Eczema   . Fibroid 2016   uterus  . Infection 2015   UTI; was treated    Patient Active Problem List   Diagnosis Date Noted  . S/P cesarean section 05/11/2015  . Preterm labor 04/26/2015  . NVD (normal vaginal delivery) 06/08/2014  . Active bleeding 06/07/2014    Past Surgical History:  Procedure Laterality Date  . CESAREAN SECTION  2006   classical vertical skin incision; transverse incision on uterus  . CESAREAN SECTION N/A 05/11/2015   Procedure: CESAREAN SECTION;  Surgeon: Frederico Hamman, MD;  Location: [facility];  Service: Obstetrics;  Laterality: N/A;  . THERAPEUTIC ABORTION      OB History    Gravida Para Term Preterm AB Living   5 3 2 1 2 2    SAB TAB Ectopic Multiple Live Births   1 1 0 0 3       Home Medications    Prior to Admission medications   Medication Sig Start Date End Date Taking? Authorizing  Provider  albuterol (PROVENTIL HFA;VENTOLIN HFA) 108 (90 Base) MCG/ACT inhaler Inhale into the lungs every 6 (six) hours as needed for wheezing or shortness of breath.    [provider]  amoxicillin (AMOXIL) 500 MG capsule Take 1 capsule (500 mg total) by mouth 3 (three) times daily. 11/29/16   Fransico Meadow, PA-C  amoxicillin-clavulanate (AUGMENTIN) 875-125 MG tablet Take 1 tablet by mouth every 12 (twelve) hours. Patient not taking: Reported on 06/18/2016 05/18/16   Montine Circle, PA-C  benzonatate (TESSALON) 100 MG capsule Take 2 capsules (200 mg total) by mouth 2 (two) times daily as needed for cough. Patient not taking: Reported on 06/18/2016 05/18/16   Montine Circle, PA-C  cetirizine (ZYRTEC ALLERGY) 10 MG tablet Take 1 tablet (10 mg total) by mouth daily. Patient not taking: Reported on 06/18/2016 05/18/16   Montine Circle, PA-C  HYDROcodone-acetaminophen (NORCO/VICODIN) 5-325 MG tablet Take 1 tablet by mouth every 4 (four) hours as needed. 06/19/16   Charlann Lange, PA-C  metroNIDAZOLE (METROGEL VAGINAL) 0.75 % vaginal gel Place 1 Applicatorful vaginally at bedtime. Patient not taking: Reported on 06/18/2016 04/14/16   Rasch, Anderson Malta I, NP  naproxen (NAPROSYN) 500 MG tablet Take 1 tablet (500 mg total) by mouth 2 (two) times daily. 06/19/16   Charlann Lange, PA-C    Family History Family  History  Adopted: Yes    Social History Social History   Tobacco Use  . Smoking status: Never Smoker  . Smokeless tobacco: Never Used  Substance Use Topics  . Alcohol use: No    Comment: occ  . Drug use: No     Allergies   Patient has no known allergies.   Review of Systems Review of Systems  Constitutional: Positive for fever. Negative for chills and fatigue.  HENT: Positive for sore throat. Negative for ear pain, facial swelling, rhinorrhea, trouble swallowing and voice change.   Respiratory: Negative for cough and shortness of breath.   Gastrointestinal: Negative  for abdominal pain, nausea and vomiting.     Physical Exam Updated Vital Signs BP 125/84 (BP Location: Right Arm)   Pulse 71   Temp 98.8 F (37.1 C) (Oral)   Resp 18   LMP 06/27/2017   SpO2 100%   Physical Exam  Constitutional: She is oriented to person, place, and time. She appears well-developed and well-nourished. No distress.  Patient in no acute distress, calmly answers questions at bedside.  HENT:  Head: Normocephalic and atraumatic.  Right Ear: Hearing, tympanic membrane and ear canal normal.  Left Ear: Hearing, tympanic membrane and ear canal normal.  Mucous membranes moist.  Oropharynx erythematous, tonsillar exudate present.  Uvula midline.  Able to handle oral secretions.  No trismus.  Airway patent.  Eyes: Right eye exhibits no discharge. Left eye exhibits no discharge.  Neck: Normal range of motion. Neck supple.  Pulmonary/Chest: Effort normal. No respiratory distress.  Lymphadenopathy:    She has cervical adenopathy.  Neurological: She is alert and oriented to person, place, and time. Coordination normal.  Skin: She is not diaphoretic.  Psychiatric: She has a normal mood and affect. Her behavior is normal.  Nursing note and vitals reviewed.    ED Treatments / Results  Labs (all labs ordered are listed, but only abnormal results are displayed) Labs Reviewed  RAPID STREP SCREEN (NOT AT [facility]) - Abnormal; Notable for the following components:      Result Value   Streptococcus, Group A Screen (Direct) POSITIVE (*)    All other components within normal limits    EKG  EKG Interpretation None       Radiology No results found.  Procedures Procedures (including critical care time)  Medications Ordered in ED Medications - No data to display   Initial Impression / Assessment and Plan / ED Course  I have reviewed the triage vital signs and the nursing notes.  Pertinent labs & imaging results that were available during my care of the patient were  reviewed by me and considered in my medical decision making (see chart for details).    Pt afebrile with tonsillar exudate, cervical lymphadenopathy, & odynophagia. Rapid strep test positive. Treated in the ED with PCN IM.  Pt does not appear dehydrated, discussed importance of water rehydration. Presentation non concerning for PTA or RPA. No trismus or uvula deviation. Specific return precautions discussed. Pt able to drink water in ED without difficulty with intact air way. Recommended PCP follow up.  Patient agrees and voiced understanding to the above plan.  Final Clinical Impressions(s) / ED Diagnoses   Final diagnoses:  Strep throat    ED Discharge Orders    None       Bernarda Caffey 07/14/17 2147    Forde Dandy, MD 07/15/17 [phone]

## 2018-06-26 ENCOUNTER — Other Ambulatory Visit: Payer: Self-pay

## 2018-06-26 ENCOUNTER — Emergency Department (HOSPITAL_COMMUNITY)
Admission: EM | Admit: 2018-06-26 | Discharge: 2018-06-26 | Disposition: A | Discharge status: Home / Self Care | Payer: Self-pay | Attending: Emergency Medicine | Admitting: Emergency Medicine

## 2018-06-26 ENCOUNTER — Encounter (HOSPITAL_COMMUNITY): Payer: Self-pay

## 2018-06-26 DIAGNOSIS — X58XXXA Exposure to other specified factors, initial encounter: Secondary | ICD-10-CM | POA: Insufficient documentation

## 2018-06-26 DIAGNOSIS — S46811A Strain of other muscles, fascia and tendons at shoulder and upper arm level, right arm, initial encounter: Secondary | ICD-10-CM | POA: Insufficient documentation

## 2018-06-26 DIAGNOSIS — Y999 Unspecified external cause status: Secondary | ICD-10-CM | POA: Insufficient documentation

## 2018-06-26 DIAGNOSIS — M542 Cervicalgia: Secondary | ICD-10-CM | POA: Insufficient documentation

## 2018-06-26 DIAGNOSIS — S46812A Strain of other muscles, fascia and tendons at shoulder and upper arm level, left arm, initial encounter: Secondary | ICD-10-CM | POA: Insufficient documentation

## 2018-06-26 DIAGNOSIS — Y929 Unspecified place or not applicable: Secondary | ICD-10-CM | POA: Insufficient documentation

## 2018-06-26 DIAGNOSIS — Y939 Activity, unspecified: Secondary | ICD-10-CM | POA: Insufficient documentation

## 2018-06-26 DIAGNOSIS — Z79899 Other long term (current) drug therapy: Secondary | ICD-10-CM | POA: Insufficient documentation

## 2018-06-26 MED ORDER — DIAZEPAM 5 MG PO TABS: 5.0000 mg | ORAL_TABLET | Freq: Every evening | ORAL | 0 refills | Status: DC | PRN

## 2018-06-26 MED ORDER — KETOROLAC TROMETHAMINE 60 MG/2ML IM SOLN
15.0000 mg | Freq: Once | INTRAMUSCULAR | Status: AC
  Administered 2018-06-26: 15 mg via INTRAMUSCULAR
  Filled 2018-06-26: qty 2

## 2018-06-26 MED ORDER — ACETAMINOPHEN 500 MG PO TABS
1000.0000 mg | ORAL_TABLET | Freq: Once | ORAL | Status: AC
  Administered 2018-06-26: 1000 mg via ORAL
  Filled 2018-06-26: qty 2

## 2018-06-26 MED ORDER — OXYCODONE HCL 5 MG PO TABS
5.0000 mg | ORAL_TABLET | Freq: Once | ORAL | Status: AC
  Administered 2018-06-26: 5 mg via ORAL
  Filled 2018-06-26: qty 1

## 2018-06-26 MED ORDER — DIAZEPAM 5 MG PO TABS
5.0000 mg | ORAL_TABLET | Freq: Once | ORAL | Status: AC
  Administered 2018-06-26: 5 mg via ORAL
  Filled 2018-06-26: qty 1

## 2018-06-26 NOTE — ED Triage Notes (Signed)
Pt reports neck pain and chest tightness for the past 5 days, pt taking oxycotin at home without relief of symptoms. Hx of asthma.

## 2018-06-26 NOTE — ED Provider Notes (Signed)
[facility] EMERGENCY DEPARTMENT Provider Note   CSN: [id number] Arrival date & time: 06/26/18  2144     History   Chief Complaint Chief Complaint  Patient presents with  . Neck Pain  . Chest Pain    HPI Caitlin Greer is a 32 y.o. female.  32 yo F with a chief complaint of neck pain.  Going on for the past 5 days.  Not improving with oxycodone.  Worse when she turns or twists.  Describes as a sharp and stabbing pain.  Denies unilateral numbness or weakness.  She feels that it radiates into her shoulder and into the top of her chest.  The history is provided by the patient.  Neck Pain   This is a new problem. The current episode started more than 2 days ago. The problem occurs constantly. The problem has been gradually worsening. The pain is associated with nothing. There has been no fever. The pain is present in the left side and right side. The quality of the pain is described as stabbing and shooting. The pain is at a severity of 8/10. The pain is moderate. Pertinent negatives include no chest pain and no headaches. Treatments tried: Narcotics. The treatment provided mild relief.  Chest Pain   Pertinent negatives include no dizziness, no fever, no headaches, no nausea, no palpitations, no shortness of breath and no vomiting.    Past Medical History:  Diagnosis Date  . Asthma    managed with rescue inhaler only; last used 8 months ago  . Eczema   . Fibroid 2016   uterus  . Infection 2015   UTI; was treated    Patient Active Problem List   Diagnosis Date Noted  . S/P cesarean section 05/11/2015  . Preterm labor 04/26/2015  . NVD (normal vaginal delivery) 06/08/2014  . Active bleeding 06/07/2014    Past Surgical History:  Procedure Laterality Date  . CESAREAN SECTION  2006   classical vertical skin incision; transverse incision on uterus  . CESAREAN SECTION N/A 05/11/2015   Procedure: CESAREAN SECTION;  Surgeon: Frederico Hamman, MD;   Location: [facility];  Service: Obstetrics;  Laterality: N/A;  . THERAPEUTIC ABORTION       OB History    Gravida  5   Para  3   Term  2   Preterm  1   AB  2   Living  2     SAB  1   TAB  1   Ectopic  0   Multiple  0   Live Births  3            Home Medications    Prior to Admission medications   Medication Sig Start Date End Date Taking? Authorizing Provider  albuterol (PROVENTIL HFA;VENTOLIN HFA) 108 (90 Base) MCG/ACT inhaler Inhale into the lungs every 6 (six) hours as needed for wheezing or shortness of breath.    [provider]  amoxicillin (AMOXIL) 500 MG capsule Take 1 capsule (500 mg total) by mouth 3 (three) times daily. 11/29/16   Fransico Meadow, PA-C  amoxicillin-clavulanate (AUGMENTIN) 875-125 MG tablet Take 1 tablet by mouth every 12 (twelve) hours. Patient not taking: Reported on 06/18/2016 05/18/16   Montine Circle, PA-C  benzonatate (TESSALON) 100 MG capsule Take 2 capsules (200 mg total) by mouth 2 (two) times daily as needed for cough. Patient not taking: Reported on 06/18/2016 05/18/16   Montine Circle, PA-C  cetirizine (ZYRTEC ALLERGY) 10 MG tablet  Take 1 tablet (10 mg total) by mouth daily. Patient not taking: Reported on 06/18/2016 05/18/16   Montine Circle, PA-C  diazepam (VALIUM) 5 MG tablet Take 1 tablet (5 mg total) by mouth at bedtime as needed for muscle spasms (spasms). 06/26/18   Deno Etienne, DO  HYDROcodone-acetaminophen (NORCO/VICODIN) 5-325 MG tablet Take 1 tablet by mouth every 4 (four) hours as needed. 06/19/16   Charlann Lange, PA-C  metroNIDAZOLE (METROGEL VAGINAL) 0.75 % vaginal gel Place 1 Applicatorful vaginally at bedtime. Patient not taking: Reported on 06/18/2016 04/14/16   Rasch, Anderson Malta I, NP  naproxen (NAPROSYN) 500 MG tablet Take 1 tablet (500 mg total) by mouth 2 (two) times daily. 06/19/16   Charlann Lange, PA-C    Family History Family History  Adopted: Yes    Social History Social History    Tobacco Use  . Smoking status: Never Smoker  . Smokeless tobacco: Never Used  Substance Use Topics  . Alcohol use: No    Comment: occ  . Drug use: No     Allergies   Patient has no known allergies.   Review of Systems Review of Systems  Constitutional: Negative for chills and fever.  HENT: Negative for congestion and rhinorrhea.   Eyes: Negative for redness and visual disturbance.  Respiratory: Negative for shortness of breath and wheezing.   Cardiovascular: Negative for chest pain and palpitations.  Gastrointestinal: Negative for nausea and vomiting.  Genitourinary: Negative for dysuria and urgency.  Musculoskeletal: Positive for arthralgias and neck pain. Negative for myalgias.  Skin: Negative for pallor and wound.  Neurological: Negative for dizziness and headaches.     Physical Exam Updated Vital Signs BP 123/86 (BP Location: Left Arm)   Pulse 76   Temp 98.4 F (36.9 C) (Oral)   Resp 12   Ht 5\' 7"  (1.702 m)   Wt 63 kg   SpO2 100%   BMI 21.77 kg/m   Physical Exam  Constitutional: She is oriented to person, place, and time. She appears well-developed and well-nourished. No distress.  HENT:  Head: Normocephalic and atraumatic.  Eyes: Pupils are equal, round, and reactive to light. EOM are normal.  Neck: Normal range of motion. Neck supple.  Cardiovascular: Normal rate and regular rhythm. Exam reveals no gallop and no friction rub.  No murmur heard. Pulmonary/Chest: Effort normal. She has no wheezes. She has no rales.  Abdominal: Soft. She exhibits no distension. There is no tenderness.  Musculoskeletal: She exhibits tenderness. She exhibits no edema.  Tender to palpation about both trapezius, worse on the right than the left.  No midline spinal tenderness able to rotate her head 45 degrees in either direction without midline pain.  Neurological: She is alert and oriented to person, place, and time.  Skin: Skin is warm and dry. She is not diaphoretic.   Psychiatric: She has a normal mood and affect. Her behavior is normal.  Nursing note and vitals reviewed.    ED Treatments / Results  Labs (all labs ordered are listed, but only abnormal results are displayed) Labs Reviewed - No data to display  EKG EKG Interpretation  Date/Time:  Saturday June 26 2018 21:58:17 EST Ventricular Rate:  79 PR Interval:    QRS Duration: 84 QT Interval:  366 QTC Calculation: 420 R Axis:   117 Text Interpretation:  Sinus rhythm Right axis deviation No old tracing to compare Confirmed by Deno Etienne [id number]) on 06/26/2018 10:27:56 PM   Radiology No results found.  Procedures Procedures (including critical care  time)  Medications Ordered in ED Medications  acetaminophen (TYLENOL) tablet 1,000 mg (has no administration in time range)  ketorolac (TORADOL) injection 15 mg (has no administration in time range)  oxyCODONE (Oxy IR/ROXICODONE) immediate release tablet 5 mg (has no administration in time range)  diazepam (VALIUM) tablet 5 mg (has no administration in time range)     Initial Impression / Assessment and Plan / ED Course  I have reviewed the triage vital signs and the nursing notes.  Pertinent labs & imaging results that were available during my care of the patient were reviewed by me and considered in my medical decision making (see chart for details).     32 yo F with a chief complaint of bilateral neck pain.  Reproduced with palpation of the trapezius muscles bilaterally.  No injury no midline spinal tenderness.  Will treat as musculoskeletal and have her follow-up with her family doctor.  10:31 PM:  I have discussed the diagnosis/risks/treatment options with the patient and believe the pt to be eligible for discharge home to follow-up with PCP. We also discussed returning to the ED immediately if new or worsening sx occur. We discussed the sx which are most concerning (e.g., sudden worsening pain, fever, inability to tolerate by  mouth) that necessitate immediate return. Medications administered to the patient during their visit and any new prescriptions provided to the patient are listed below.  Medications given during this visit Medications  acetaminophen (TYLENOL) tablet 1,000 mg (has no administration in time range)  ketorolac (TORADOL) injection 15 mg (has no administration in time range)  oxyCODONE (Oxy IR/ROXICODONE) immediate release tablet 5 mg (has no administration in time range)  diazepam (VALIUM) tablet 5 mg (has no administration in time range)      The patient appears reasonably screen and/or stabilized for discharge and I doubt any other medical condition or other [facility] requiring further screening, evaluation, or treatment in the ED at this time prior to discharge.    Final Clinical Impressions(s) / ED Diagnoses   Final diagnoses:  Trapezius strain, left, initial encounter  Trapezius strain, right, initial encounter    ED Discharge Orders         Ordered    diazepam (VALIUM) 5 MG tablet  At bedtime PRN     06/26/18 2216           Deno Etienne, DO 06/26/18 2231

## 2018-06-26 NOTE — Discharge Instructions (Signed)
Take 4 over the counter ibuprofen tablets 3 times a day or 2 over-the-counter naproxen tablets twice a day for pain. Also take tylenol 1000mg(2 extra strength) four times a day.    

## 2019-04-21 ENCOUNTER — Emergency Department (HOSPITAL_COMMUNITY)
Admission: EM | Admit: 2019-04-21 | Discharge: 2019-04-21 | Disposition: A | Discharge status: Home / Self Care | Payer: Self-pay | Attending: Emergency Medicine | Admitting: Emergency Medicine

## 2019-04-21 ENCOUNTER — Encounter (HOSPITAL_COMMUNITY): Payer: Self-pay | Admitting: Emergency Medicine

## 2019-04-21 ENCOUNTER — Other Ambulatory Visit: Payer: Self-pay

## 2019-04-21 DIAGNOSIS — H66002 Acute suppurative otitis media without spontaneous rupture of ear drum, left ear: Secondary | ICD-10-CM | POA: Insufficient documentation

## 2019-04-21 MED ORDER — AMOXICILLIN-POT CLAVULANATE 875-125 MG PO TABS
1.0000 | ORAL_TABLET | Freq: Once | ORAL | Status: AC
  Administered 2019-04-21: 1 via ORAL
  Filled 2019-04-21: qty 1

## 2019-04-21 MED ORDER — IBUPROFEN 800 MG PO TABS
800.0000 mg | ORAL_TABLET | Freq: Once | ORAL | Status: AC
  Administered 2019-04-21: 800 mg via ORAL
  Filled 2019-04-21: qty 1

## 2019-04-21 MED ORDER — AMOXICILLIN-POT CLAVULANATE 875-125 MG PO TABS: 1.0000 | ORAL_TABLET | Freq: Two times a day (BID) | ORAL | 0 refills | Status: AC

## 2019-04-21 MED ORDER — NAPROXEN 500 MG PO TABS: 500.0000 mg | ORAL_TABLET | Freq: Two times a day (BID) | ORAL | 0 refills | Status: DC

## 2019-04-21 NOTE — ED Triage Notes (Signed)
Pt states she started having left ear pain a few hours ago. Pt states it feels like an infection. Denies any drainage.

## 2019-04-21 NOTE — Discharge Instructions (Signed)
Take antibiotics as prescribed.  Take the entire course, even if your symptoms improve. Take naproxen 2 times a day with meals.  Do not take other anti-inflammatories at the same time (Advil, Motrin, ibuprofen, Aleve). You may supplement with Tylenol if you need further pain control. Follow-up with your primary care doctor as needed if your symptoms not proving. Return to the emergency room if you develop any new, worsening, or concerning symptoms.

## 2019-04-21 NOTE — ED Notes (Signed)
Pt was called several times for room. Pt came up after went to next patient and said she had gone to get something to eat.

## 2019-04-22 NOTE — ED Provider Notes (Signed)
[facility] EMERGENCY DEPARTMENT Provider Note   CSN: [id number] Arrival date & time: 04/21/19  1609     History   Chief Complaint Chief Complaint  Patient presents with  . Otitis Media    HPI Caitlin Greer is a 33 y.o. female presenting for evaluation of left ear pain.  Patient states today she developed left ear pain.  Is described as an ache.  She has not taken anything for this including Tylenol ibuprofen.  She denies fevers, chills, nasal congestion, sore throat, cough.  She denies sick contacts.  She denies a previous history of ear problems.  She has not been on any antibiotics recently.     HPI  Past Medical History:  Diagnosis Date  . Asthma    managed with rescue inhaler only; last used 8 months ago  . Eczema   . Fibroid 2016   uterus  . Infection 2015   UTI; was treated    Patient Active Problem List   Diagnosis Date Noted  . S/P cesarean section 05/11/2015  . Preterm labor 04/26/2015  . NVD (normal vaginal delivery) 06/08/2014  . Active bleeding 06/07/2014    Past Surgical History:  Procedure Laterality Date  . CESAREAN SECTION  2006   classical vertical skin incision; transverse incision on uterus  . CESAREAN SECTION N/A 05/11/2015   Procedure: CESAREAN SECTION;  Surgeon: Frederico Hamman, MD;  Location: [facility];  Service: Obstetrics;  Laterality: N/A;  . THERAPEUTIC ABORTION       OB History    Gravida  5   Para  3   Term  2   Preterm  1   AB  2   Living  2     SAB  1   TAB  1   Ectopic  0   Multiple  0   Live Births  3            Home Medications    Prior to Admission medications   Medication Sig Start Date End Date Taking? Authorizing Provider  albuterol (PROVENTIL HFA;VENTOLIN HFA) 108 (90 Base) MCG/ACT inhaler Inhale into the lungs every 6 (six) hours as needed for wheezing or shortness of breath.    [provider]  amoxicillin (AMOXIL) 500 MG capsule Take 1 capsule (500 mg total)  by mouth 3 (three) times daily. 11/29/16   Fransico Meadow, PA-C  amoxicillin-clavulanate (AUGMENTIN) 875-125 MG tablet Take 1 tablet by mouth every 12 (twelve) hours for 7 days. 04/21/19 04/28/19  Adalie Mand, PA-C  benzonatate (TESSALON) 100 MG capsule Take 2 capsules (200 mg total) by mouth 2 (two) times daily as needed for cough. Patient not taking: Reported on 06/18/2016 05/18/16   Montine Circle, PA-C  cetirizine (ZYRTEC ALLERGY) 10 MG tablet Take 1 tablet (10 mg total) by mouth daily. Patient not taking: Reported on 06/18/2016 05/18/16   Montine Circle, PA-C  diazepam (VALIUM) 5 MG tablet Take 1 tablet (5 mg total) by mouth at bedtime as needed for muscle spasms (spasms). 06/26/18   Deno Etienne, DO  HYDROcodone-acetaminophen (NORCO/VICODIN) 5-325 MG tablet Take 1 tablet by mouth every 4 (four) hours as needed. 06/19/16   Charlann Lange, PA-C  metroNIDAZOLE (METROGEL VAGINAL) 0.75 % vaginal gel Place 1 Applicatorful vaginally at bedtime. Patient not taking: Reported on 06/18/2016 04/14/16   Rasch, Anderson Malta I, NP  naproxen (NAPROSYN) 500 MG tablet Take 1 tablet (500 mg total) by mouth 2 (two) times daily with a meal. 04/21/19  Anvay Tennis, PA-C    Family History Family History  Adopted: Yes    Social History Social History   Tobacco Use  . Smoking status: Never Smoker  . Smokeless tobacco: Never Used  Substance Use Topics  . Alcohol use: No    Comment: occ  . Drug use: No     Allergies   Patient has no known allergies.   Review of Systems Review of Systems  Constitutional: Negative for fever.  HENT: Positive for ear pain.      Physical Exam Updated Vital Signs BP (!) 130/99   Pulse 63   Temp 98.5 F (36.9 C) (Oral)   Resp 16   Ht 5\' 6"  (1.676 m)   Wt 63.5 kg   LMP 04/11/2019   SpO2 100%   BMI 22.60 kg/m   Physical Exam Vitals signs and nursing note reviewed.  Constitutional:      General: She is not in acute distress.    Appearance: She is  well-developed.     Comments: Sitting comfortably in bed in no acute distress  HENT:     Head: Normocephalic and atraumatic.     Right Ear: Tympanic membrane is not erythematous or bulging.     Left Ear: Tympanic membrane is erythematous and bulging.     Ears:     Comments: Left TM erythematous and bulging.  Right TM normal.      Nose:     Comments: No nasal congestion noted    Mouth/Throat:     Comments: OP clear without tonsillar swelling or exudate.  Uvula midline with equal palate rise. Neck:     Musculoskeletal: Normal range of motion.  Cardiovascular:     Rate and Rhythm: Normal rate and regular rhythm.     Pulses: Normal pulses.  Pulmonary:     Effort: Pulmonary effort is normal.     Breath sounds: Normal breath sounds.  Abdominal:     General: There is no distension.  Musculoskeletal: Normal range of motion.  Skin:    General: Skin is warm.     Findings: No rash.  Neurological:     Mental Status: She is alert and oriented to person, place, and time.      ED Treatments / Results  Labs (all labs ordered are listed, but only abnormal results are displayed) Labs Reviewed - No data to display  EKG None  Radiology No results found.  Procedures Procedures (including critical care time)  Medications Ordered in ED Medications  ibuprofen (ADVIL) tablet 800 mg (800 mg Oral Given 04/21/19 2239)  amoxicillin-clavulanate (AUGMENTIN) 875-125 MG per tablet 1 tablet (1 tablet Oral Given 04/21/19 2239)     Initial Impression / Assessment and Plan / ED Course  I have reviewed the triage vital signs and the nursing notes.  Pertinent labs & imaging results that were available during my care of the patient were reviewed by me and considered in my medical decision making (see chart for details).        Pt presenting for evaluation of left ear pain.  Physical exam consistent with otitis media.  Discussed treatment with NSAIDs and antibiotics.  At this time, patient appears  safe for discharge.  Return precautions given.  Patient states she understands and agrees to plan.   Final Clinical Impressions(s) / ED Diagnoses   Final diagnoses:  Non-recurrent acute suppurative otitis media of left ear without spontaneous rupture of tympanic membrane    ED Discharge Orders  Ordered    amoxicillin-clavulanate (AUGMENTIN) 875-125 MG tablet  Every 12 hours     04/21/19 2153    naproxen (NAPROSYN) 500 MG tablet  2 times daily with meals     04/21/19 2153           Franchot Heidelberg, PA-C 04/22/19 0016    Lucrezia Starch, MD 04/23/19 [phone]

## 2019-12-30 ENCOUNTER — Emergency Department (HOSPITAL_COMMUNITY): Admission: EM | Admit: 2019-12-30 | Discharge: 2019-12-31 | Discharge status: Against Medical Advice | Payer: Self-pay

## 2019-12-30 ENCOUNTER — Other Ambulatory Visit: Payer: Self-pay

## 2019-12-30 NOTE — ED Notes (Signed)
Pt called three times to triage and did not respond.

## 2020-02-05 ENCOUNTER — Encounter (HOSPITAL_COMMUNITY): Payer: Self-pay | Admitting: Emergency Medicine

## 2020-02-05 ENCOUNTER — Emergency Department (HOSPITAL_COMMUNITY)
Admission: EM | Admit: 2020-02-05 | Discharge: 2020-02-05 | Disposition: A | Discharge status: Home / Self Care | Payer: 59 | Attending: Emergency Medicine | Admitting: Emergency Medicine

## 2020-02-05 ENCOUNTER — Other Ambulatory Visit: Payer: Self-pay

## 2020-02-05 DIAGNOSIS — J45909 Unspecified asthma, uncomplicated: Secondary | ICD-10-CM | POA: Insufficient documentation

## 2020-02-05 DIAGNOSIS — N10 Acute tubulo-interstitial nephritis: Secondary | ICD-10-CM | POA: Insufficient documentation

## 2020-02-05 DIAGNOSIS — N12 Tubulo-interstitial nephritis, not specified as acute or chronic: Secondary | ICD-10-CM

## 2020-02-05 DIAGNOSIS — R300 Dysuria: Secondary | ICD-10-CM | POA: Diagnosis present

## 2020-02-05 LAB — URINALYSIS, ROUTINE W REFLEX MICROSCOPIC
Bilirubin Urine: NEGATIVE
Glucose, UA: NEGATIVE mg/dL
Ketones, ur: NEGATIVE mg/dL
Nitrite: POSITIVE — AB
Protein, ur: 100 mg/dL — AB
Specific Gravity, Urine: 1.018 (ref 1.005–1.030)
WBC, UA: >50 WBC/hpf — ABNORMAL HIGH (ref 0–5)
pH: 6 (ref 5.0–8.0)

## 2020-02-05 LAB — PREGNANCY, URINE: Preg Test, Ur: NEGATIVE

## 2020-02-05 MED ORDER — CEPHALEXIN 250 MG PO CAPS
500.0000 mg | ORAL_CAPSULE | Freq: Once | ORAL | Status: AC
  Administered 2020-02-05: 500 mg via ORAL
  Filled 2020-02-05: qty 2

## 2020-02-05 MED ORDER — CEPHALEXIN 500 MG PO CAPS: 1000.0000 mg | ORAL_CAPSULE | Freq: Two times a day (BID) | ORAL | 0 refills | Status: AC

## 2020-02-05 NOTE — ED Triage Notes (Addendum)
C/o nausea, lower back pain, and cloudy urine x 2 days.  Denies fever.  States she has had chills.

## 2020-02-05 NOTE — ED Notes (Signed)
Patient verbalizes understanding of discharge instructions. Opportunity for questioning and answers were provided. Armband removed by staff, pt discharged from ED stable & ambulatory  

## 2020-02-05 NOTE — Discharge Instructions (Signed)
Prescription sent to pharmacy for Keflex.  This antibiotic used to treat pyelonephritis.  Take as prescribed.  Return to the emergency department for any new or worsening symptoms.

## 2020-02-05 NOTE — ED Provider Notes (Signed)
[facility] EMERGENCY DEPARTMENT Provider Note   CSN: [id number] Arrival date & time: 02/05/20  1545     History Chief Complaint  Patient presents with   Back Pain   Dysuria    Caitlin Greer is a 34 y.o. female with past medical history significant for asthma, eczema, uterine fibroid.  HPI Presents emergency department today with chief complaint of progressively worsening back pain and dysuria x2 days.  She describes the pain as burning and sharp. She states the pain is located in middle lower abdomen and radiates around to her back. She also endorses urinary frequency and chills. She has a history of UTIs and this feels similar, last UTI was >1 year ago. She denies fever, nausea, emesis, vaginal discharge, abnormal vaginal bleeding, gross hematuria. LMP on 01/22/2020. She is not concerned for STIs.    Past Medical History:  Diagnosis Date   Asthma    managed with rescue inhaler only; last used 8 months ago   Eczema    Fibroid 2016   uterus   Infection 2015   UTI; was treated    Patient Active Problem List   Diagnosis Date Noted   S/P cesarean section 05/11/2015   Preterm labor 04/26/2015   NVD (normal vaginal delivery) 06/08/2014   Active bleeding 06/07/2014    Past Surgical History:  Procedure Laterality Date   CESAREAN SECTION  2006   classical vertical skin incision; transverse incision on uterus   CESAREAN SECTION N/A 05/11/2015   Procedure: CESAREAN SECTION;  Surgeon: Frederico Hamman, MD;  Location: [facility];  Service: Obstetrics;  Laterality: N/A;   THERAPEUTIC ABORTION       OB History    Gravida  5   Para  3   Term  2   Preterm  1   AB  2   Living  2     SAB  1   TAB  1   Ectopic  0   Multiple  0   Live Births  3           Family History  Adopted: Yes    Social History   Tobacco Use   Smoking status: Never Smoker   Smokeless tobacco: Never Used  Substance Use Topics   Alcohol use: No      Comment: occ   Drug use: No    Home Medications Prior to Admission medications   Medication Sig Start Date End Date Taking? Authorizing Provider  albuterol (PROVENTIL HFA;VENTOLIN HFA) 108 (90 Base) MCG/ACT inhaler Inhale into the lungs every 6 (six) hours as needed for wheezing or shortness of breath.    [provider]  amoxicillin (AMOXIL) 500 MG capsule Take 1 capsule (500 mg total) by mouth 3 (three) times daily. 11/29/16   Fransico Meadow, PA-C  benzonatate (TESSALON) 100 MG capsule Take 2 capsules (200 mg total) by mouth 2 (two) times daily as needed for cough. Patient not taking: Reported on 06/18/2016 05/18/16   Montine Circle, PA-C  cephALEXin (KEFLEX) 500 MG capsule Take 2 capsules (1,000 mg total) by mouth 2 (two) times daily for 14 days. 02/05/20 02/19/20  Alano Blasco, Harley Hallmark, PA-C  cetirizine (ZYRTEC ALLERGY) 10 MG tablet Take 1 tablet (10 mg total) by mouth daily. Patient not taking: Reported on 06/18/2016 05/18/16   Montine Circle, PA-C  diazepam (VALIUM) 5 MG tablet Take 1 tablet (5 mg total) by mouth at bedtime as needed for muscle spasms (spasms). 06/26/18   Deno Etienne,  DO  HYDROcodone-acetaminophen (NORCO/VICODIN) 5-325 MG tablet Take 1 tablet by mouth every 4 (four) hours as needed. 06/19/16   Charlann Lange, PA-C  metroNIDAZOLE (METROGEL VAGINAL) 0.75 % vaginal gel Place 1 Applicatorful vaginally at bedtime. Patient not taking: Reported on 06/18/2016 04/14/16   Rasch, Anderson Malta I, NP  naproxen (NAPROSYN) 500 MG tablet Take 1 tablet (500 mg total) by mouth 2 (two) times daily with a meal. 04/21/19   Caccavale, Sophia, PA-C    Allergies    Patient has no known allergies.  Review of Systems   Review of Systems All other systems are reviewed and are negative for acute change except as noted in the HPI.  Physical Exam Updated Vital Signs BP (!) 120/92 (BP Location: Left Arm)    Pulse 76    Temp 98.4 F (36.9 C) (Oral)    Resp 18    Ht 5\' 6"  (1.676 m)    Wt  63.5 kg    LMP 01/22/2020    SpO2 99%    BMI 22.60 kg/m   Physical Exam Vitals and nursing note reviewed.  Constitutional:      General: She is not in acute distress.    Appearance: She is not ill-appearing.  HENT:     Head: Normocephalic and atraumatic.     Right Ear: Tympanic membrane and external ear normal.     Left Ear: Tympanic membrane and external ear normal.     Nose: Nose normal.     Mouth/Throat:     Mouth: Mucous membranes are moist.     Pharynx: Oropharynx is clear.  Eyes:     General: No scleral icterus.       Right eye: No discharge.        Left eye: No discharge.     Extraocular Movements: Extraocular movements intact.     Conjunctiva/sclera: Conjunctivae normal.     Pupils: Pupils are equal, round, and reactive to light.  Neck:     Vascular: No JVD.  Cardiovascular:     Rate and Rhythm: Normal rate and regular rhythm.     Pulses: Normal pulses.          Radial pulses are 2+ on the right side and 2+ on the left side.     Heart sounds: Normal heart sounds.  Pulmonary:     Comments: Lungs clear to auscultation in all fields. Symmetric chest rise. No wheezing, rales, or rhonchi. Abdominal:     Tenderness: There is right CVA tenderness. There is no left CVA tenderness.     Comments: Abdomen is soft, non-distended. Suprapubic tenderness to palpation, no rigidity, no guarding. No peritoneal signs.  Musculoskeletal:        General: Normal range of motion.     Cervical back: Normal range of motion.  Skin:    General: Skin is warm and dry.     Capillary Refill: Capillary refill takes less than 2 seconds.  Neurological:     Mental Status: She is oriented to person, place, and time.     GCS: GCS eye subscore is 4. GCS verbal subscore is 5. GCS motor subscore is 6.     Comments: Fluent speech, no facial droop.  Psychiatric:        Behavior: Behavior normal.     ED Results / Procedures / Treatments   Labs (all labs ordered are listed, but only abnormal results  are displayed) Labs Reviewed  URINALYSIS, ROUTINE W REFLEX MICROSCOPIC - Abnormal; Notable for the following components:  Result Value   APPearance CLOUDY (*)    Hgb urine dipstick MODERATE (*)    Protein, ur 100 (*)    Nitrite POSITIVE (*)    Leukocytes,Ua LARGE (*)    WBC, UA >50 (*)    Bacteria, UA RARE (*)    All other components within normal limits  URINE CULTURE  PREGNANCY, URINE    EKG None  Radiology No results found.  Procedures Procedures (including critical care time)  Medications Ordered in ED Medications  cephALEXin (KEFLEX) capsule 500 mg (has no administration in time range)    ED Course  I have reviewed the triage vital signs and the nursing notes.  Pertinent labs & imaging results that were available during my care of the patient were reviewed by me and considered in my medical decision making (see chart for details).    MDM Rules/Calculators/A&P                          History provided by patient with additional history obtained from chart review.    Pt has been diagnosed with a UTI. Pregnancy test negative. Pt is afebrile, normotensive, and denies N/V. She does have right CVA tenderness and mild suprapubic tenderness on exam. Patient is politely refusing to have lab work collected. She is currently in the process of getting medical insurance and does not want to have a large hospital bill. I discussed the importance of labs to check kidney function, WBC, electrolytes given she clinically has pyelonephritis. She continues to refuse. Will send urine culture and treat for pyelonephritis with keflex x 14 days. Strict return precautions discussed. Recommend  follow up with PCP if symptoms persist. Patient stable to be discharged home.   Portions of this note were generated with Lobbyist. Dictation errors may occur despite best attempts at proofreading.    Final Clinical Impression(s) / ED Diagnoses Final diagnoses:  Pyelonephritis     Rx / DC Orders ED Discharge Orders         Ordered    cephALEXin (KEFLEX) 500 MG capsule  2 times daily     Discontinue  Reprint     02/05/20 2052           Lonie Rummell, Harley Hallmark, PA-C 02/05/20 2116    Varney Biles, MD 02/05/20 2125

## 2020-02-08 LAB — URINE CULTURE: Culture: >=100000 — AB

## 2020-03-12 ENCOUNTER — Other Ambulatory Visit: Payer: Self-pay

## 2020-03-12 ENCOUNTER — Ambulatory Visit (HOSPITAL_COMMUNITY)
Admission: EM | Admit: 2020-03-12 | Discharge: 2020-03-12 | Disposition: A | Discharge status: Home / Self Care | Payer: 59 | Attending: Family Medicine | Admitting: Family Medicine

## 2020-03-12 ENCOUNTER — Encounter (HOSPITAL_COMMUNITY): Payer: Self-pay | Admitting: Emergency Medicine

## 2020-03-12 DIAGNOSIS — M79602 Pain in left arm: Secondary | ICD-10-CM | POA: Diagnosis not present

## 2020-03-12 DIAGNOSIS — T63461A Toxic effect of venom of wasps, accidental (unintentional), initial encounter: Secondary | ICD-10-CM

## 2020-03-12 DIAGNOSIS — L299 Pruritus, unspecified: Secondary | ICD-10-CM

## 2020-03-12 MED ORDER — METHYLPREDNISOLONE SODIUM SUCC 125 MG IJ SOLR
125.0000 mg | Freq: Once | INTRAMUSCULAR | Status: AC
  Administered 2020-03-12: 125 mg via INTRAMUSCULAR

## 2020-03-12 MED ORDER — METHYLPREDNISOLONE SODIUM SUCC 125 MG IJ SOLR
INTRAMUSCULAR | Status: AC
  Filled 2020-03-12: qty 2

## 2020-03-12 MED ORDER — IBUPROFEN 800 MG PO TABS
ORAL_TABLET | ORAL | Status: AC
  Filled 2020-03-12: qty 1

## 2020-03-12 MED ORDER — IBUPROFEN 800 MG PO TABS
800.0000 mg | ORAL_TABLET | Freq: Once | ORAL | Status: AC
  Administered 2020-03-12: 800 mg via ORAL

## 2020-03-12 MED ORDER — PREDNISONE 20 MG PO TABS: 40.0000 mg | ORAL_TABLET | Freq: Every day | ORAL | 0 refills | Status: DC

## 2020-03-12 NOTE — ED Triage Notes (Signed)
Yellow jacket stung patient this morning in left arm.  States arm hurts, burning and arm is numb

## 2020-03-14 NOTE — ED Provider Notes (Signed)
[facility]   [id number] 03/12/20 Arrival Time: 3614  ASSESSMENT & PLAN:  1. Left arm pain   2. Itching   3. Yellow jacket sting, accidental or unintentional, initial encounter     Meds ordered this encounter  Medications  . methylPREDNISolone sodium succinate (SOLU-MEDROL) 125 mg/2 mL injection 125 mg  . ibuprofen (ADVIL) tablet 800 mg  . predniSONE (DELTASONE) 20 MG tablet    Sig: Take 2 tablets (40 mg total) by mouth daily.    Dispense:  10 tablet    Refill:  0    Reviewed expectations re: course of current medical issues. Questions answered. Outlined signs and symptoms indicating need for more acute intervention. Patient verbalized understanding. After Visit Summary given.   SUBJECTIVE: History from: patient. Caitlin Greer is a 34 y.o. female who presents for evaluation of a possible allergic reaction. Reports single yellow jacket sting to L arm; this am. Area is painful. No respiratory or swallowing difficulty. "My hand feels a little tingly also". No OTC tx reported. No h/o severe rxn to stings.  OBJECTIVE:  Vitals:   03/12/20 1220  BP: (!) 128/95  Pulse: 78  Resp: (!) 24  Temp: 98.7 F (37.1 C)  TempSrc: Oral  SpO2: 100%    Recheck RR: 18 (appears nervious) General appearance: alert; no distress Eyes: PERRLA; EOMI; conjunctiva normal HENT: normocephalic; atraumatic Lungs: clear to auscultation bilaterally; unlabored Heart: regular Abdomen: soft Extremities: no cyanosis or edema; symmetrical with no gross deformities Skin: warm and dry; left forearm with small area of erythema where sting occurred; no stinger identified; area is tender to touch; normal elbow and wrist ROM on the left Neurologic: normal gait Psychological: alert and cooperative; normal mood and affect    No Known Allergies  Past Medical History:  Diagnosis Date  . Asthma    managed with rescue inhaler only; last used 8 months ago  . Eczema   . Fibroid 2016   uterus   . Infection 2015   UTI; was treated   Social History   Socioeconomic History  . Marital status: Single    Spouse name: Not on file  . Number of children: Not on file  . Years of education: Not on file  . Highest education level: Not on file  Occupational History  . Not on file  Tobacco Use  . Smoking status: Never Smoker  . Smokeless tobacco: Never Used  Substance and Sexual Activity  . Alcohol use: No    Comment: occ  . Drug use: No  . Sexual activity: Yes    Birth control/protection: Condom  Other Topics Concern  . Not on file  Social History Narrative  . Not on file   Social Determinants of Health   Financial Resource Strain:   . Difficulty of Paying Living Expenses:   Food Insecurity:   . Worried About Charity fundraiser in the Last Year:   . Arboriculturist in the Last Year:   Transportation Needs:   . Film/video editor (Medical):   Marland Kitchen Lack of Transportation (Non-Medical):   Physical Activity:   . Days of Exercise per Week:   . Minutes of Exercise per Session:   Stress:   . Feeling of Stress :   Social Connections:   . Frequency of Communication with Friends and Family:   . Frequency of Social Gatherings with Friends and Family:   . Attends Religious Services:   . Active Member of Clubs or Organizations:   .  Attends Archivist Meetings:   Marland Kitchen Marital Status:   Intimate Partner Violence:   . Fear of Current or Ex-Partner:   . Emotionally Abused:   Marland Kitchen Physically Abused:   . Sexually Abused:    Family History  Adopted: Yes   Past Surgical History:  Procedure Laterality Date  . CESAREAN SECTION  2006   classical vertical skin incision; transverse incision on uterus  . CESAREAN SECTION N/A 05/11/2015   Procedure: CESAREAN SECTION;  Surgeon: Frederico Hamman, MD;  Location: [facility];  Service: Obstetrics;  Laterality: N/A;  . THERAPEUTIC ABORTION       Vanessa Kick, MD 03/14/20 1001

## 2020-03-31 ENCOUNTER — Inpatient Hospital Stay (HOSPITAL_COMMUNITY)
Admission: AD | Admit: 2020-03-31 | Discharge: 2020-03-31 | Disposition: A | Discharge status: Home / Self Care | Payer: 59 | Attending: Obstetrics and Gynecology | Admitting: Obstetrics and Gynecology

## 2020-03-31 ENCOUNTER — Other Ambulatory Visit: Payer: Self-pay

## 2020-03-31 ENCOUNTER — Inpatient Hospital Stay (HOSPITAL_COMMUNITY): Admission: AD | Admit: 2020-03-31 | Payer: 59 | Source: Home / Self Care | Admitting: Obstetrics and Gynecology

## 2020-03-31 DIAGNOSIS — O99891 Other specified diseases and conditions complicating pregnancy: Secondary | ICD-10-CM | POA: Insufficient documentation

## 2020-03-31 DIAGNOSIS — Z3A00 Weeks of gestation of pregnancy not specified: Secondary | ICD-10-CM | POA: Diagnosis not present

## 2020-03-31 DIAGNOSIS — N898 Other specified noninflammatory disorders of vagina: Secondary | ICD-10-CM | POA: Diagnosis not present

## 2020-03-31 NOTE — MAU Note (Addendum)
Pt talked with house coverage, paige, regarding care. Pt verbalizes understanding of plan. Pt states she would like to go back over the ED to seek further treatment and to speak to them. This RN offered to show pt how to get back over to the ED, pt left on her own prior to RN helping her over to the ED. Pt discharged from MAU.

## 2020-03-31 NOTE — MAU Note (Signed)
Caitlin Greer is a 34 y.o. here in MAU reporting: one month ago was dx with UTI and was sent home with abx. Then got a yeast infection so she took monistat. Then was bit by a bumble bee and was given steroids. 5 days ago started having an odor. States no discharge but does notice an odor.  LMP: 03/05/20  Onset of complaint: ongoing  Pain score: 0/10  Vitals:   03/31/20 1622 03/31/20 1736  BP: 114/80 116/79  Pulse: 84 86  Resp: 16 16  Temp: 99.1 F (37.3 C) 98.7 F (37.1 C)  SpO2: 100% 100%     Lab orders placed from triage: none

## 2020-03-31 NOTE — ED Triage Notes (Signed)
Emergency Medicine Provider OB Triage Evaluation Note  Caitlin Greer is a 34 y.o. female, who presents to the emergency department with complaints of vaginal odor.  Review of  Systems  Positive: vaginal odor, breast tenderness, missed menstrual cycle Negative: discharge, abdominal pain  Physical Exam  BP 114/80 (BP Location: Right Arm)   Pulse 84   Temp 99.1 F (37.3 C) (Oral)   Resp 16   SpO2 100%  General: Awake, no distress  HEENT: Atraumatic  Resp: Normal effort  Cardiac: Normal rate Abd: Nondistended, nontender  MSK: Moves all extremities without difficulty Neuro: Speech clear  Medical Decision Making  Pt evaluated for GYN concern and is stable for transfer to MAU. Pt is in agreement with plan for transfer.  5:24 PM Patient was transferred to MAU without my knowledge. Plan was to obtain upreg prior to transfer.   Clinical Impression   1. Vaginal odor        Tacy Learn, PA-C 03/31/20 1724

## 2020-03-31 NOTE — MAU Provider Note (Signed)
Chief Complaint: Vaginal Discharge   None     SUBJECTIVE HPI: Caitlin Greer is a 34 y.o. here with abnormal vaginal discharge. She has no pain or bleeding. She has no concerns at all about being pregnant.   She presented to the main ED initially.      Social History   Socioeconomic History   Marital status: Single    Spouse name: Not on file   Number of children: Not on file   Years of education: Not on file   Highest education level: Not on file  Occupational History   Not on file  Tobacco Use   Smoking status: Not on file  Substance and Sexual Activity   Alcohol use: Not on file   Drug use: Not on file   Sexual activity: Not on file  Other Topics Concern   Not on file  Social History Narrative   Not on file   Social Determinants of Health   Financial Resource Strain:    Difficulty of Paying Living Expenses: Not on file  Food Insecurity:    Worried About [name] in the Last Year: Not on file   Ran Out of Food in the Last Year: Not on file  Transportation Needs:    Lack of Transportation (Medical): Not on file   Lack of Transportation (Non-Medical): Not on file  Physical Activity:    Days of Exercise per Week: Not on file   Minutes of Exercise per Session: Not on file  Stress:    Feeling of Stress : Not on file  Social Connections:    Frequency of Communication with Friends and Family: Not on file   Frequency of Social Gatherings with Friends and Family: Not on file   Attends Religious Services: Not on file   Active Member of Clubs or Organizations: Not on file   Attends Archivist Meetings: Not on file   Marital Status: Not on file  Intimate Partner Violence:    Fear of Current or Ex-Partner: Not on file   Emotionally Abused: Not on file   Physically Abused: Not on file   Sexually Abused: Not on file   No current facility-administered medications on file prior to encounter.   No current outpatient  medications on file prior to encounter.   Not on File  ROS:  Review of Systems  Gastrointestinal: Negative for abdominal pain.  Genitourinary: Positive for vaginal discharge. Negative for vaginal bleeding.    I have reviewed patient's Past Medical Hx, Surgical Hx, Family Hx, Social Hx, medications and allergies.   Physical Exam   Patient Vitals for the past 24 hrs:  BP Temp Temp src Pulse Resp SpO2  03/31/20 1736 116/79 98.7 F (37.1 C) Oral 86 16 100 %  03/31/20 1622 114/80 99.1 F (37.3 C) Oral 84 16 100 %   Physical Exam Psychiatric:        Mood and Affect: Affect is blunt, angry and inappropriate.        Behavior: Behavior is agitated and aggressive.    MDM Patient denies any concerning symptoms in need of emergent evaluation.   No call received from the ED regarding transfer.  Offered the patient a referral/appointment in the office ASAP to be evaluated. Patient requested to speak to a supervisor.  Patient up to the desk once again requesting supervisor. Patient loud and angry. Nurse informed patient that the supervisor was on her way.  Golda Acre at the bedside speaking to patient.  Patient  offered again to be referred to our office for a visit for her GYN complaint. Patient refused and reports she wants to go back to the ED to speak to them.   ASSESSMENT MSE Complete Vaginal discharge  Non emergent complaint   PLAN Discharge patient at her request to seek non-emergent medical care elsewhere  Julius Matus, Artist Pais, NP 03/31/2020 5:44 PM

## 2020-04-02 ENCOUNTER — Encounter (HOSPITAL_COMMUNITY): Payer: Self-pay | Admitting: Emergency Medicine

## 2020-04-02 ENCOUNTER — Other Ambulatory Visit: Payer: Self-pay

## 2020-04-02 ENCOUNTER — Encounter (HOSPITAL_COMMUNITY): Payer: Self-pay | Admitting: Obstetrics and Gynecology

## 2020-04-02 ENCOUNTER — Inpatient Hospital Stay (HOSPITAL_COMMUNITY)
Admission: AD | Admit: 2020-04-02 | Discharge: 2020-04-02 | Discharge status: Against Medical Advice | Payer: 59 | Attending: Obstetrics and Gynecology | Admitting: Obstetrics and Gynecology

## 2020-04-02 ENCOUNTER — Ambulatory Visit (INDEPENDENT_AMBULATORY_CARE_PROVIDER_SITE_OTHER)
Admission: EM | Admit: 2020-04-02 | Discharge: 2020-04-02 | Disposition: A | Discharge status: Home / Self Care | Payer: 59 | Source: Home / Self Care

## 2020-04-02 DIAGNOSIS — R112 Nausea with vomiting, unspecified: Secondary | ICD-10-CM | POA: Insufficient documentation

## 2020-04-02 DIAGNOSIS — Z3201 Encounter for pregnancy test, result positive: Secondary | ICD-10-CM

## 2020-04-02 DIAGNOSIS — Z7952 Long term (current) use of systemic steroids: Secondary | ICD-10-CM | POA: Insufficient documentation

## 2020-04-02 DIAGNOSIS — Z3A01 Less than 8 weeks gestation of pregnancy: Secondary | ICD-10-CM | POA: Diagnosis not present

## 2020-04-02 DIAGNOSIS — O209 Hemorrhage in early pregnancy, unspecified: Secondary | ICD-10-CM | POA: Insufficient documentation

## 2020-04-02 DIAGNOSIS — N898 Other specified noninflammatory disorders of vagina: Secondary | ICD-10-CM | POA: Diagnosis not present

## 2020-04-02 DIAGNOSIS — J45909 Unspecified asthma, uncomplicated: Secondary | ICD-10-CM | POA: Insufficient documentation

## 2020-04-02 DIAGNOSIS — Z5329 Procedure and treatment not carried out because of patient's decision for other reasons: Secondary | ICD-10-CM

## 2020-04-02 DIAGNOSIS — O99511 Diseases of the respiratory system complicating pregnancy, first trimester: Secondary | ICD-10-CM | POA: Insufficient documentation

## 2020-04-02 HISTORY — DX: Gestational (pregnancy-induced) hypertension without significant proteinuria, unspecified trimester: O13.9

## 2020-04-02 LAB — POCT URINALYSIS DIPSTICK, ED / UC
Bilirubin Urine: NEGATIVE
Glucose, UA: NEGATIVE mg/dL
Hgb urine dipstick: NEGATIVE
Leukocytes,Ua: NEGATIVE
Nitrite: NEGATIVE
Protein, ur: NEGATIVE mg/dL
Specific Gravity, Urine: >=1.03 (ref 1.005–1.030)
Urobilinogen, UA: 1 mg/dL (ref 0.0–1.0)
pH: 5.5 (ref 5.0–8.0)

## 2020-04-02 LAB — POC URINE PREG, ED: Preg Test, Ur: POSITIVE — AB

## 2020-04-02 MED ORDER — DOXYLAMINE-PYRIDOXINE 10-10 MG PO TBEC: 1.0000 | DELAYED_RELEASE_TABLET | Freq: Three times a day (TID) | ORAL | 0 refills | Status: DC | PRN

## 2020-04-02 NOTE — ED Triage Notes (Signed)
Pt presents to [facility] for assessment of vaginal odor x 5 days.  Patient denies vaginal discharge.  Patient also c/o lower abdominal cramping.  States her period was due 8/29.

## 2020-04-02 NOTE — MAU Note (Signed)
Pt reports she had pregnancy confirmed at urgent care. Started spotting and cramping yesterday.

## 2020-04-02 NOTE — MAU Provider Note (Signed)
First Provider Initiated Contact with Patient 04/02/20 1514      Chief Complaint:  Vaginal Bleeding and Abdominal Pain   Caitlin Greer is  34 y.o. [id number] at [redacted]w[redacted]d presents complaining of Vaginal Bleeding and Abdominal Pain . Stated that she was seen at Urgent Care earlier this afternoon for vaginal odor and cramping for 5 days.  Her period is a week late, so a UPT was done which was +.  Stated that she was sent here from Urgent Care "to make sure everything is OK with the baby".  Now states that she has had bleeding and cramping for "a few days".    Obstetrical/Gynecological History: OB History    Gravida  6   Para  3   Term  2   Preterm  1   AB  2   Living  2     SAB  1   TAB  1   Ectopic  0   Multiple  0   Live Births  3          Past Medical History: Past Medical History:  Diagnosis Date  . Asthma    managed with rescue inhaler only; last used 8 months ago  . Eczema   . Fibroid 2016   uterus  . Infection 2015   UTI; was treated  . Pregnancy induced hypertension     Past Surgical History: Past Surgical History:  Procedure Laterality Date  . CESAREAN SECTION  2006   classical vertical skin incision; transverse incision on uterus  . CESAREAN SECTION N/A 05/11/2015   Procedure: CESAREAN SECTION;  Surgeon: Frederico Hamman, MD;  Location: [facility];  Service: Obstetrics;  Laterality: N/A;  . THERAPEUTIC ABORTION      Family History: Family History  Adopted: Yes    Social History: Social History   Tobacco Use  . Smoking status: Never Smoker  . Smokeless tobacco: Never Used  Substance Use Topics  . Alcohol use: No    Comment: occ  . Drug use: No    Allergies: No Known Allergies  Meds:  Medications Prior to Admission  Medication Sig Dispense Refill Last Dose  . Doxylamine-Pyridoxine 10-10 MG TBEC Take 1 tablet by mouth 3 (three) times daily as needed. 60 tablet 0   . predniSONE (DELTASONE) 20 MG tablet Take 2 tablets (40 mg total) by  mouth daily. 10 tablet 0     Review of Systems   Constitutional: Negative for fever and chills Eyes: Negative for visual disturbances Respiratory: Negative for shortness of breath, dyspnea Cardiovascular: Negative for chest pain or palpitations  Gastrointestinal: Negative for vomiting, diarrhea and constipation Genitourinary: Negative for dysuria and urgency Musculoskeletal: Negative for back pain, joint pain, myalgias.  Normal ROM  Neurological: Negative for dizziness and headaches    Physical Exam  Blood pressure 118/81, pulse 88, height 5\' 6"  (1.676 m), weight 61.2 kg, last menstrual period 02/23/2020. GENERAL: Well-developed, well-nourished female in no acute distress.  LUNGS: Normal respiratory effort HEART: Regular rate and rhythm. ABDOMEN: declined DTR's 2+ PELVIC:  Declined. Stated that she had a pelvic exam at Urgent Care.  It was not documented, so I contacted Merrie Roof, PA, who stated that she did not do a pelvic exam. ( A swab was collected and sent by the RN for GU diseases--pt was instructed to contact her OB/GYN to start [facility], return precautions given).  Labs: Results for orders placed or performed during the hospital encounter of 04/02/20 (from the past 24 hour(s))  POC Urinalysis dipstick   Collection Time: 04/02/20  1:04 PM  Result Value Ref Range   Glucose, UA NEGATIVE NEGATIVE mg/dL   Bilirubin Urine NEGATIVE NEGATIVE   Ketones, ur TRACE (A) NEGATIVE mg/dL   Specific Gravity, Urine >=1.030 1.005 - 1.030   Hgb urine dipstick NEGATIVE NEGATIVE   pH 5.5 5.0 - 8.0   Protein, ur NEGATIVE NEGATIVE mg/dL   Urobilinogen, UA 1.0 0.0 - 1.0 mg/dL   Nitrite NEGATIVE NEGATIVE   Leukocytes,Ua NEGATIVE NEGATIVE  POC urine pregnancy   Collection Time: 04/02/20  1:08 PM  Result Value Ref Range   Preg Test, Ur POSITIVE (A) NEGATIVE   Imaging Studies:  No results found.  Assessment: Caitlin Greer is  34 y.o. [id number] at [redacted]w[redacted]d presents with C/O vaginal bleeding and  cramping for several days.  Plan: Pt refused all recommended treatments (bloodwork/pelvic exam/US) because "I need to get home to my kids".  Discussed that in the setting of bleeding/pain/early pregnancy, and ectopic pregnancy needed to be ruled out, as it can be life threatening.  Pt signed AMA form and left.   Christin Fudge 9/6/20213:33 PM

## 2020-04-02 NOTE — MAU Note (Signed)
Pt unable to stay for further evaluation due to child care issue Informed pt that she could return at anytime if needed for further medical treatment. Pt planning to go to regular OB/GYN office this week. Notified Provider of pt desire to leave. AMA form signed

## 2020-04-02 NOTE — Discharge Instructions (Addendum)
You can take B6 and unisom available over the counter together to create a more affordable alternative to the diclegis should your insurance not cover the medication. Make sure to set up an appointment with OBGYN in the next few weeks to establish and check in on your pregnancy.

## 2020-04-02 NOTE — ED Provider Notes (Signed)
[place]    CSN: [id number] Arrival date & time: 04/02/20  1046      History   Chief Complaint Chief Complaint  Patient presents with  . vaginal odor    HPI Caitlin Greer is a 34 y.o. female.   Here today with about a week of vaginal odor, bloating, lower abdominal cramping, and nausea. Is also about a week late for her menstrual cycle which she states is very abnormal for her. Is sexually active, no known exposures to STIs, abnormal vaginal bleeding, hematuria, dysuria, flank pain, fevers, chills. Got COVID tested several days ago which came back negative yesterday. Not trying anything so far for sxs.      Past Medical History:  Diagnosis Date  . Asthma    managed with rescue inhaler only; last used 8 months ago  . Eczema   . Fibroid 2016   uterus  . Infection 2015   UTI; was treated    Patient Active Problem List   Diagnosis Date Noted  . S/P cesarean section 05/11/2015  . Preterm labor 04/26/2015  . NVD (normal vaginal delivery) 06/08/2014  . Active bleeding 06/07/2014    Past Surgical History:  Procedure Laterality Date  . CESAREAN SECTION  2006   classical vertical skin incision; transverse incision on uterus  . CESAREAN SECTION N/A 05/11/2015   Procedure: CESAREAN SECTION;  Surgeon: Frederico Hamman, MD;  Location: [facility];  Service: Obstetrics;  Laterality: N/A;  . THERAPEUTIC ABORTION      OB History    Gravida  5   Para  3   Term  2   Preterm  1   AB  2   Living  2     SAB  1   TAB  1   Ectopic  0   Multiple  0   Live Births  3            Home Medications    Prior to Admission medications   Medication Sig Start Date End Date Taking? Authorizing Provider  Doxylamine-Pyridoxine 10-10 MG TBEC Take 1 tablet by mouth 3 (three) times daily as needed. 04/02/20   Volney American, PA-C  predniSONE (DELTASONE) 20 MG tablet Take 2 tablets (40 mg total) by mouth daily. 03/12/20   Vanessa Kick, MD  cetirizine  (ZYRTEC ALLERGY) 10 MG tablet Take 1 tablet (10 mg total) by mouth daily. Patient not taking: Reported on 06/18/2016 05/18/16 03/12/20  Montine Circle, PA-C    Family History Family History  Adopted: Yes    Social History Social History   Tobacco Use  . Smoking status: Never Smoker  . Smokeless tobacco: Never Used  Substance Use Topics  . Alcohol use: No    Comment: occ  . Drug use: No     Allergies   Patient has no known allergies.   Review of Systems Review of Systems PER HPI   Physical Exam Triage Vital Signs ED Triage Vitals  Enc Vitals Group     BP 04/02/20 1209 114/75     Pulse Rate 04/02/20 1209 92     Resp 04/02/20 1209 18     Temp 04/02/20 1209 98.5 F (36.9 C)     Temp Source 04/02/20 1209 Oral     SpO2 04/02/20 1209 100 %     Weight --      Height --      Head Circumference --      Peak Flow --  Pain Score 04/02/20 1210 4     Pain Loc --      Pain Edu? --      Excl. in [place]? --    No data found.  Updated Vital Signs BP 114/75 (BP Location: Right Arm)   Pulse 92   Temp 98.5 F (36.9 C) (Oral)   Resp 18   LMP 02/24/2020   SpO2 100%   Visual Acuity Right Eye Distance:   Left Eye Distance:   Bilateral Distance:    Right Eye Near:   Left Eye Near:    Bilateral Near:     Physical Exam Vitals and nursing note reviewed.  Constitutional:      Appearance: Normal appearance. She is not ill-appearing.  HENT:     Head: Atraumatic.  Eyes:     Extraocular Movements: Extraocular movements intact.     Conjunctiva/sclera: Conjunctivae normal.  Cardiovascular:     Rate and Rhythm: Normal rate and regular rhythm.     Heart sounds: Normal heart sounds.  Pulmonary:     Effort: Pulmonary effort is normal.     Breath sounds: Normal breath sounds.  Abdominal:     General: Bowel sounds are normal. There is no distension.     Palpations: Abdomen is soft.     Tenderness: There is abdominal tenderness (diffuse mild ttp). There is no right CVA  tenderness, left CVA tenderness or guarding.  Musculoskeletal:        General: Normal range of motion.     Cervical back: Normal range of motion and neck supple.  Skin:    General: Skin is warm and dry.  Neurological:     Mental Status: She is alert and oriented to person, place, and time.  Psychiatric:        Mood and Affect: Mood normal.        Thought Content: Thought content normal.        Judgment: Judgment normal.    UC Treatments / Results  Labs (all labs ordered are listed, but only abnormal results are displayed) Labs Reviewed  POC URINE PREG, ED - Abnormal; Notable for the following components:      Result Value   Preg Test, Ur POSITIVE (*)    All other components within normal limits  POCT URINALYSIS DIPSTICK, ED / UC - Abnormal; Notable for the following components:   Ketones, ur TRACE (*)    All other components within normal limits  CERVICOVAGINAL ANCILLARY ONLY    EKG   Radiology No results found.  Procedures Procedures (including critical care time)  Medications Ordered in UC Medications - No data to display  Initial Impression / Assessment and Plan / UC Course  I have reviewed the triage vital signs and the nursing notes.  Pertinent labs & imaging results that were available during my care of the patient were reviewed by me and considered in my medical decision making (see chart for details).     Awaiting aptima swab to r/o vaginal infections, positive pregnancy test today which patient was informed of. Educated on what to expect in early pregnancy, discussed seeking OBGYN care in the next month or so to establish. Can take diclegis (OTC substitutions reviewed if not affordable) for control of the nausea and vomiting she is experiencing. Strict return precautions reviewed, particularly for worsening cramping, vaginal bleeding, abdominal pain, etc  Final Clinical Impressions(s) / UC Diagnoses   Final diagnoses:  Non-intractable vomiting with nausea,  unspecified vomiting type  Positive pregnancy test  Discharge Instructions     You can take B6 and unisom available over the counter together to create a more affordable alternative to the diclegis should your insurance not cover the medication. Make sure to set up an appointment with OBGYN in the next few weeks to establish and check in on your pregnancy.     ED Prescriptions    Medication Sig Dispense Auth. Provider   Doxylamine-Pyridoxine 10-10 MG TBEC Take 1 tablet by mouth 3 (three) times daily as needed. 60 tablet Volney American, [place]     PDMP not reviewed this encounter.   Volney American, [place] 04/02/20 1325

## 2020-04-03 LAB — CERVICOVAGINAL ANCILLARY ONLY
Bacterial Vaginitis (gardnerella): POSITIVE — AB
Candida Glabrata: NEGATIVE
Candida Vaginitis: NEGATIVE
Chlamydia: NEGATIVE
Comment: NEGATIVE
Comment: NEGATIVE
Comment: NEGATIVE
Comment: NEGATIVE
Comment: NEGATIVE
Comment: NORMAL
Neisseria Gonorrhea: NEGATIVE
Trichomonas: NEGATIVE

## 2020-04-04 ENCOUNTER — Telehealth (HOSPITAL_COMMUNITY): Payer: Self-pay | Admitting: Family Medicine

## 2020-04-04 ENCOUNTER — Telehealth (HOSPITAL_COMMUNITY): Payer: Self-pay

## 2020-04-04 MED ORDER — METRONIDAZOLE 0.75 % VA GEL: 1.0000 | Freq: Two times a day (BID) | VAGINAL | 0 refills | Status: DC

## 2020-04-04 NOTE — Telephone Encounter (Signed)
Tested positive for bacterial vaginosis, will send in metronidazole gel to treat given her recent positive pregnancy test. F/u with OBGYN as discussed for initial OB appt

## 2021-05-07 ENCOUNTER — Other Ambulatory Visit: Payer: Self-pay

## 2021-05-07 ENCOUNTER — Ambulatory Visit (HOSPITAL_COMMUNITY)
Admission: EM | Admit: 2021-05-07 | Discharge: 2021-05-07 | Disposition: A | Discharge status: Home / Self Care | Payer: 59 | Attending: Emergency Medicine | Admitting: Emergency Medicine

## 2021-05-07 ENCOUNTER — Encounter (HOSPITAL_COMMUNITY): Payer: Self-pay

## 2021-05-07 DIAGNOSIS — B349 Viral infection, unspecified: Secondary | ICD-10-CM

## 2021-05-07 LAB — POCT RAPID STREP A, ED / UC: Streptococcus, Group A Screen (Direct): NEGATIVE

## 2021-05-07 NOTE — ED Notes (Signed)
Pt refused covid test at [facility] and would like to have it done at a local pharmacy to avoid cost.

## 2021-05-07 NOTE — ED Provider Notes (Signed)
[place]    CSN: [id number] Arrival date & time: 05/07/21  1710      History   Chief Complaint Chief Complaint  Patient presents with   Sore Throat   Generalized Body Aches    HPI Caitlin Greer is a 35 y.o. female.   Patient here for evaluation of sore throat, headache, congestion, and body aches that has been ongoing since Friday.  Reports that she lost her voice today.  Denies any recent sick contacts.  Has not tried any OTC medications or treatments.  Denies any fevers but did have chills.  Denies any trauma, injury, or other precipitating event.  Denies any specific alleviating or aggravating factors.  Denies any chest pain, shortness of breath, N/V/D, numbness, tingling, weakness, abdominal pain, or headaches.    The history is provided by the patient.  Sore Throat   Past Medical History:  Diagnosis Date   Asthma    managed with rescue inhaler only; last used 8 months ago   Eczema    Fibroid 2016   uterus   Infection 2015   UTI; was treated   Pregnancy induced hypertension     Patient Active Problem List   Diagnosis Date Noted   S/P cesarean section 05/11/2015   Preterm labor 04/26/2015   NVD (normal vaginal delivery) 06/08/2014   Active bleeding 06/07/2014    Past Surgical History:  Procedure Laterality Date   CESAREAN SECTION  2006   classical vertical skin incision; transverse incision on uterus   CESAREAN SECTION N/A 05/11/2015   Procedure: CESAREAN SECTION;  Surgeon: Frederico Hamman, MD;  Location: [facility];  Service: Obstetrics;  Laterality: N/A;   THERAPEUTIC ABORTION      OB History     Gravida  6   Para  3   Term  2   Preterm  1   AB  2   Living  2      SAB  1   IAB  1   Ectopic  0   Multiple  0   Live Births  3            Home Medications    Prior to Admission medications   Medication Sig Start Date End Date Taking? Authorizing Provider  Doxylamine-Pyridoxine 10-10 MG TBEC Take 1 tablet by  mouth 3 (three) times daily as needed. 04/02/20   Volney American, PA-C  metroNIDAZOLE (METROGEL VAGINAL) 0.75 % vaginal gel Place 1 Applicatorful vaginally 2 (two) times daily. 04/04/20   Volney American, PA-C  predniSONE (DELTASONE) 20 MG tablet Take 2 tablets (40 mg total) by mouth daily. 03/12/20   Vanessa Kick, MD  cetirizine (ZYRTEC ALLERGY) 10 MG tablet Take 1 tablet (10 mg total) by mouth daily. Patient not taking: Reported on 06/18/2016 05/18/16 03/12/20  Montine Circle, PA-C    Family History Family History  Adopted: Yes    Social History Social History   Tobacco Use   Smoking status: Never   Smokeless tobacco: Never  Substance Use Topics   Alcohol use: No    Comment: occ   Drug use: No     Allergies   Patient has no known allergies.   Review of Systems Review of Systems  Constitutional:  Positive for chills and fatigue.  HENT:  Positive for congestion and sore throat.   Respiratory:  Positive for cough.   All other systems reviewed and are negative.   Physical Exam Triage Vital Signs ED Triage Vitals  Enc Vitals Group     BP 05/07/21 1742 119/87     Pulse Rate 05/07/21 1742 98     Resp 05/07/21 1742 19     Temp 05/07/21 1742 98.6 F (37 C)     Temp Source 05/07/21 1742 Oral     SpO2 05/07/21 1742 97 %     Weight --      Height --      Head Circumference --      Peak Flow --      Pain Score 05/07/21 1740 6     Pain Loc --      Pain Edu? --      Excl. in [place]? --    No data found.  Updated Vital Signs BP 119/87 (BP Location: Left Arm)   Pulse 98   Temp 98.6 F (37 C) (Oral)   Resp 19   LMP 05/02/2021   SpO2 97%   Breastfeeding No   Visual Acuity Right Eye Distance:   Left Eye Distance:   Bilateral Distance:    Right Eye Near:   Left Eye Near:    Bilateral Near:     Physical Exam Vitals and nursing note reviewed.  Constitutional:      General: She is not in acute distress.    Appearance: Normal appearance. She is not  ill-appearing, toxic-appearing or diaphoretic.  HENT:     Head: Normocephalic and atraumatic.     Right Ear: Tympanic membrane, ear canal and external ear normal.     Left Ear: Tympanic membrane, ear canal and external ear normal.     Nose: Congestion present.     Mouth/Throat:     Pharynx: Uvula midline. Pharyngeal swelling and posterior oropharyngeal erythema present.     Tonsils: No tonsillar exudate or tonsillar abscesses. 2+ on the right. 2+ on the left.  Eyes:     Conjunctiva/sclera: Conjunctivae normal.  Cardiovascular:     Rate and Rhythm: Normal rate and regular rhythm.     Pulses: Normal pulses.     Heart sounds: Normal heart sounds.  Pulmonary:     Effort: Pulmonary effort is normal.     Breath sounds: Normal breath sounds.  Abdominal:     General: Abdomen is flat. Bowel sounds are normal.     Palpations: Abdomen is soft.  Musculoskeletal:        General: Normal range of motion.     Cervical back: Normal range of motion.  Skin:    General: Skin is warm and dry.  Neurological:     General: No focal deficit present.     Mental Status: She is alert and oriented to person, place, and time.  Psychiatric:        Mood and Affect: Mood normal.     UC Treatments / Results  Labs (all labs ordered are listed, but only abnormal results are displayed) Labs Reviewed  CULTURE, GROUP A STREP ([place])  SARS CORONAVIRUS 2 (TAT 6-24 HRS)  POCT RAPID STREP A, ED / UC    EKG   Radiology No results found.  Procedures Procedures (including critical care time)  Medications Ordered in UC Medications - No data to display  Initial Impression / Assessment and Plan / UC Course  I have reviewed the triage vital signs and the nursing notes.  Pertinent labs & imaging results that were available during my care of the patient were reviewed by me and considered in my medical decision making (see chart for details).  Assessment negative for red flags or concerns.  Rapid strep  negative, throat culture pending. COVID test pending.  This is likely a viral illness.  Discussed current CDC guidelines for quarantine and work noted given to patient.  Tylenol and/or Ibuprofen as needed.  Encouraged fluids and rest.  Discussed conservative symptom management as described in discharge instructions.  Follow up as needed.  Final Clinical Impressions(s) / UC Diagnoses   Final diagnoses:  Viral illness     Discharge Instructions      We will contact you if your COVID test is positive.  Please quarantine while you wait for the results.  If your test is negative you may resume normal activities.  If your test is positive please continue to quarantine for at least 5 days from your symptom onset or until you are without a fever for at least 24 hours after the medications.  You can take Tylenol and/or Ibuprofen as needed for fever reduction and pain relief.   For cough: honey 1/2 to 1 teaspoon (you can dilute the honey in water or another fluid).  You can also use guaifenesin and dextromethorphan for cough. You can use a humidifier for chest congestion and cough.  If you don't have a humidifier, you can sit in the bathroom with the hot shower running.     For sore throat: try warm salt water gargles, cepacol lozenges, throat spray, warm tea or water with lemon/honey, popsicles or ice, or OTC cold relief medicine for throat discomfort.    For congestion: take a daily anti-histamine like Zyrtec, Claritin, and a oral decongestant, such as pseudoephedrine.  You can also use Flonase 1-2 sprays in each nostril daily.    It is important to stay hydrated: drink plenty of fluids (water, gatorade/powerade/pedialyte, juices, or teas) to keep your throat moisturized and help further relieve irritation/discomfort.   Return or go to the Emergency Department if symptoms worsen or do not improve in the next few days.      ED Prescriptions   None    PDMP not reviewed this encounter.    Pearson Forster, NP 05/07/21 1824

## 2021-05-07 NOTE — Discharge Instructions (Signed)

## 2021-05-07 NOTE — ED Triage Notes (Signed)
Pt presents with a sore throat, headache, body aches and states she has lost her voice. Pt denies taking OTC medicine at home.

## 2021-05-10 LAB — CULTURE, GROUP A STREP (THRC)

## 2021-05-29 ENCOUNTER — Other Ambulatory Visit: Payer: Self-pay

## 2021-05-29 DIAGNOSIS — S92412A Displaced fracture of proximal phalanx of left great toe, initial encounter for closed fracture: Secondary | ICD-10-CM | POA: Insufficient documentation

## 2021-05-29 DIAGNOSIS — J45909 Unspecified asthma, uncomplicated: Secondary | ICD-10-CM | POA: Insufficient documentation

## 2021-05-29 DIAGNOSIS — S99921A Unspecified injury of right foot, initial encounter: Secondary | ICD-10-CM | POA: Diagnosis present

## 2021-05-29 DIAGNOSIS — X501XXA Overexertion from prolonged static or awkward postures, initial encounter: Secondary | ICD-10-CM | POA: Diagnosis not present

## 2021-05-30 ENCOUNTER — Encounter (HOSPITAL_BASED_OUTPATIENT_CLINIC_OR_DEPARTMENT_OTHER): Payer: Self-pay

## 2021-05-30 ENCOUNTER — Emergency Department (HOSPITAL_BASED_OUTPATIENT_CLINIC_OR_DEPARTMENT_OTHER)
Admission: EM | Admit: 2021-05-30 | Discharge: 2021-05-30 | Disposition: A | Discharge status: Home / Self Care | Payer: 59 | Attending: Emergency Medicine | Admitting: Emergency Medicine

## 2021-05-30 ENCOUNTER — Emergency Department (HOSPITAL_BASED_OUTPATIENT_CLINIC_OR_DEPARTMENT_OTHER): Payer: 59 | Admitting: Radiology

## 2021-05-30 DIAGNOSIS — S92412A Displaced fracture of proximal phalanx of left great toe, initial encounter for closed fracture: Secondary | ICD-10-CM

## 2021-05-30 DIAGNOSIS — R52 Pain, unspecified: Secondary | ICD-10-CM

## 2021-05-30 MED ORDER — NAPROXEN 250 MG PO TABS
500.0000 mg | ORAL_TABLET | Freq: Once | ORAL | Status: AC
  Administered 2021-05-30: 500 mg via ORAL
  Filled 2021-05-30: qty 2

## 2021-05-30 MED ORDER — NAPROXEN 375 MG PO TABS: ORAL_TABLET | ORAL | 0 refills | Status: DC

## 2021-05-30 MED ORDER — HYDROCODONE-ACETAMINOPHEN 5-325 MG PO TABS
1.0000 | ORAL_TABLET | Freq: Four times a day (QID) | ORAL | 0 refills | Status: DC | PRN
Start: 2021-05-30 — End: 2024-03-03

## 2021-05-30 NOTE — ED Triage Notes (Signed)
Pt c/o Right great toe pain x1 day. Pt reports she rolled over on it, feels it may be fractured

## 2021-05-30 NOTE — ED Provider Notes (Signed)
[facility] Provider Note: Georgena Spurling, MD, FACEP  CSN: [id number] MRN: [id number] ARRIVAL: 05/29/21 at 82 ROOM: [place]  Toe Injury   HISTORY OF PRESENT ILLNESS  05/30/21 3:26 AM Torrie Mayers CELINE DISHMAN is a 35 y.o. female who twisted her left foot about 8 PM yesterday evening.  She is now having pain at the base of her left great toe.  She rates her pain as an 8 out of 10, worse with palpation or weightbearing.  There is associated swelling.   Past Medical History:  Diagnosis Date   Asthma    managed with rescue inhaler only; last used 8 months ago   Eczema    Fibroid 2016   uterus   Infection 2015   UTI; was treated   Pregnancy induced hypertension     Past Surgical History:  Procedure Laterality Date   CESAREAN SECTION  2006   classical vertical skin incision; transverse incision on uterus   CESAREAN SECTION N/A 05/11/2015   Procedure: CESAREAN SECTION;  Surgeon: Frederico Hamman, MD;  Location: [facility];  Service: Obstetrics;  Laterality: N/A;   THERAPEUTIC ABORTION      Family History  Adopted: Yes    Social History   Tobacco Use   Smoking status: Never   Smokeless tobacco: Never  Substance Use Topics   Alcohol use: No    Comment: occ   Drug use: No    Prior to Admission medications   Medication Sig Start Date End Date Taking? Authorizing Provider  HYDROcodone-acetaminophen (NORCO) 5-325 MG tablet Take 1 tablet by mouth every 6 (six) hours as needed for severe pain. 05/30/21  Yes Cullen Vanallen, MD  naproxen (NAPROSYN) 375 MG tablet Take 1 tablet twice daily as needed for pain. 05/30/21  Yes Sanvika Cuttino, MD  cetirizine (ZYRTEC ALLERGY) 10 MG tablet Take 1 tablet (10 mg total) by mouth daily. Patient not taking: Reported on 06/18/2016 05/18/16 03/12/20  Montine Circle, PA-C    Allergies Patient has no known allergies.   REVIEW OF SYSTEMS  Negative except as noted here or in the History of Present Illness.   PHYSICAL  EXAMINATION  Initial Vital Signs Blood pressure (!) 130/100, pulse 63, temperature 98.4 F (36.9 C), temperature source Oral, resp. rate 16, last menstrual period 05/02/2021, SpO2 100 %.  Examination General: Well-developed, well-nourished female in no acute distress; appearance consistent with age of record HENT: normocephalic; atraumatic Eyes: Normal appearance Neck: supple Heart: regular rate and rhythm Lungs: clear to auscultation bilaterally Abdomen: soft; nondistended; nontender; bowel sounds present Extremities: No deformity; swelling and tenderness at base of left great toe Neurologic: Awake, alert and oriented; motor function intact in all extremities and symmetric; no facial droop Skin: Warm and dry Psychiatric: Normal mood and affect   RESULTS  Summary of this visit's results, reviewed and interpreted by myself:   EKG Interpretation  Date/Time:    Ventricular Rate:    PR Interval:    QRS Duration:   QT Interval:    QTC Calculation:   R Axis:     Text Interpretation:         Laboratory Studies: No results found for this or any previous visit (from the past 24 hour(s)). Imaging Studies: DG Foot Complete Left  Result Date: 05/30/2021 CLINICAL DATA:  Left great toe pain. EXAM: LEFT FOOT - COMPLETE 3+ VIEW COMPARISON:  None. FINDINGS: There is a minimally displaced fracture of the medial base the great toe proximal phalanx that extends  to the articular surface. IMPRESSION: Minimally displaced intra-articular fracture of the medial base of the great toe proximal phalanx. Electronically Signed   By: Ulyses Jarred M.D.   On: 05/30/2021 00:44    ED COURSE and MDM  Nursing notes, initial and subsequent vitals signs, including pulse oximetry, reviewed and interpreted by myself.  Vitals:   05/30/21 0011  BP: (!) 130/100  Pulse: 63  Resp: 16  Temp: 98.4 F (36.9 C)  TempSrc: Oral  SpO2: 100%   Medications  naproxen (NAPROSYN) tablet 500 mg (has no administration  in time range)    Radiograph shows fracture of the base of the left great toe.  We will treat with a postop shoe.  PROCEDURES  Procedures   ED DIAGNOSES     ICD-10-CM   1. Closed displaced fracture of proximal phalanx of left great toe, initial encounter  S92.412A       Joseandres Mazer, Jenny Reichmann, MD 05/30/21 [phone]

## 2021-07-03 ENCOUNTER — Ambulatory Visit (HOSPITAL_BASED_OUTPATIENT_CLINIC_OR_DEPARTMENT_OTHER): Payer: 59 | Admitting: Nurse Practitioner

## 2021-07-11 ENCOUNTER — Telehealth (HOSPITAL_BASED_OUTPATIENT_CLINIC_OR_DEPARTMENT_OTHER): Payer: Self-pay | Admitting: Emergency Medicine

## 2021-07-15 ENCOUNTER — Ambulatory Visit: Payer: 59 | Admitting: Orthopedic Surgery

## 2021-07-18 ENCOUNTER — Encounter (HOSPITAL_BASED_OUTPATIENT_CLINIC_OR_DEPARTMENT_OTHER): Payer: Self-pay | Admitting: Nurse Practitioner

## 2022-05-24 IMAGING — DX DG FOOT COMPLETE 3+V*L*
4 series · 4 of 4 positions shown · non-contrast
Comparison: None.

CLINICAL DATA: Left great toe pain.

EXAM:
LEFT FOOT - COMPLETE 3+ VIEW

[foot ap]
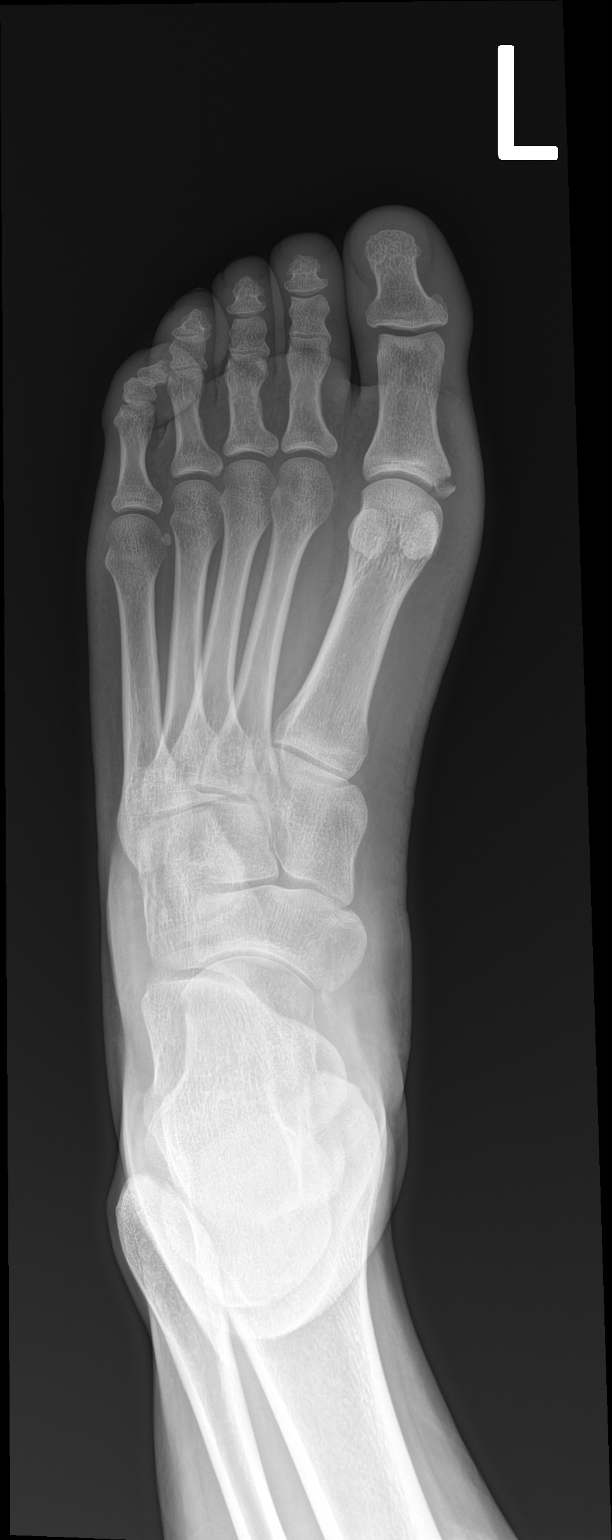

[foot obl]
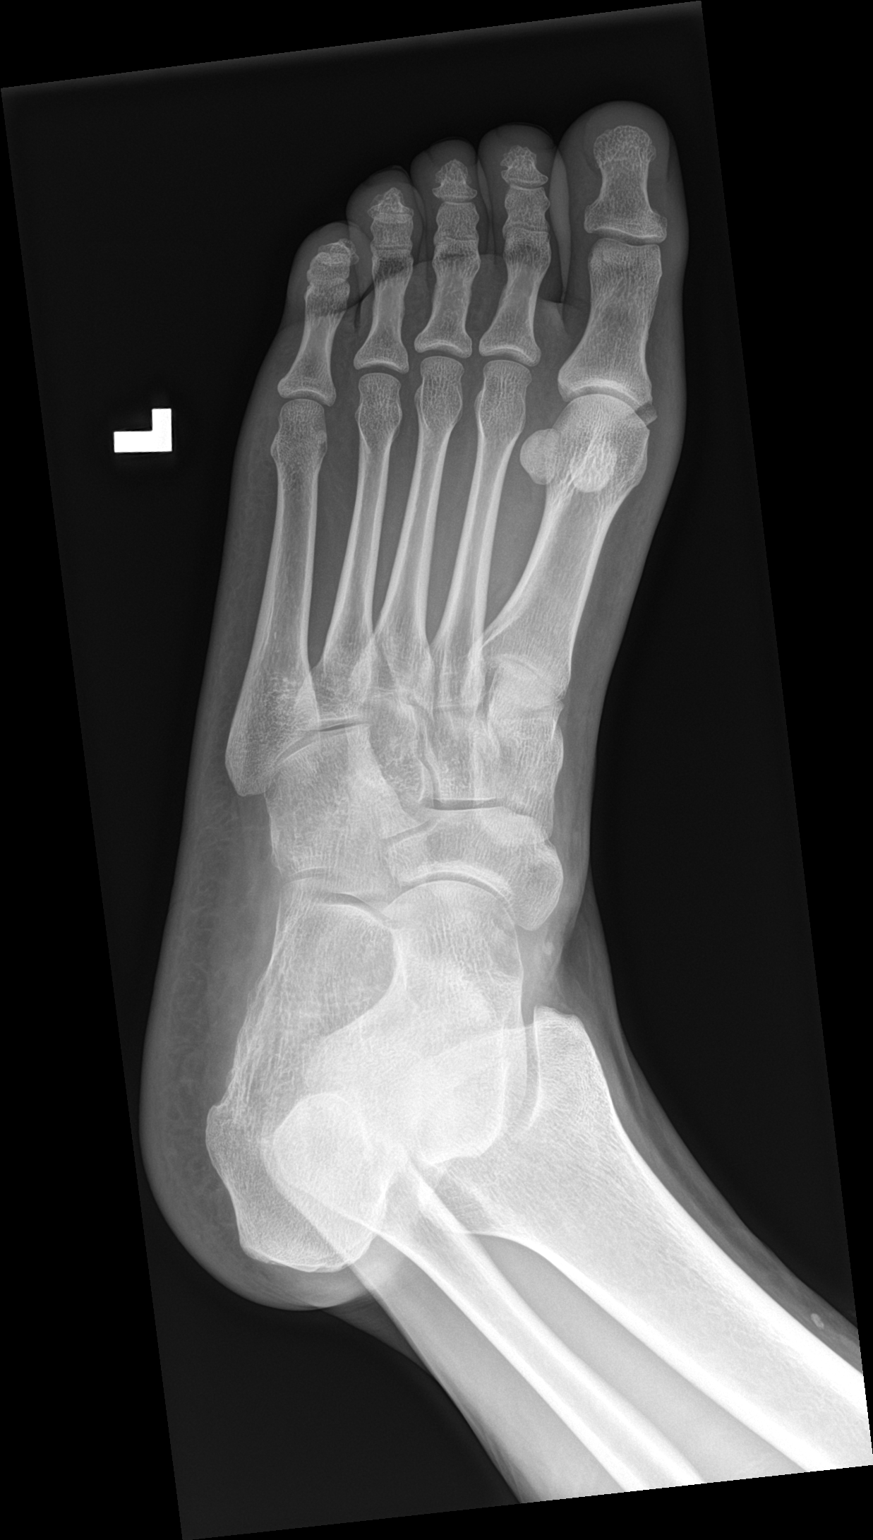

[foot lat (1 of 2)]
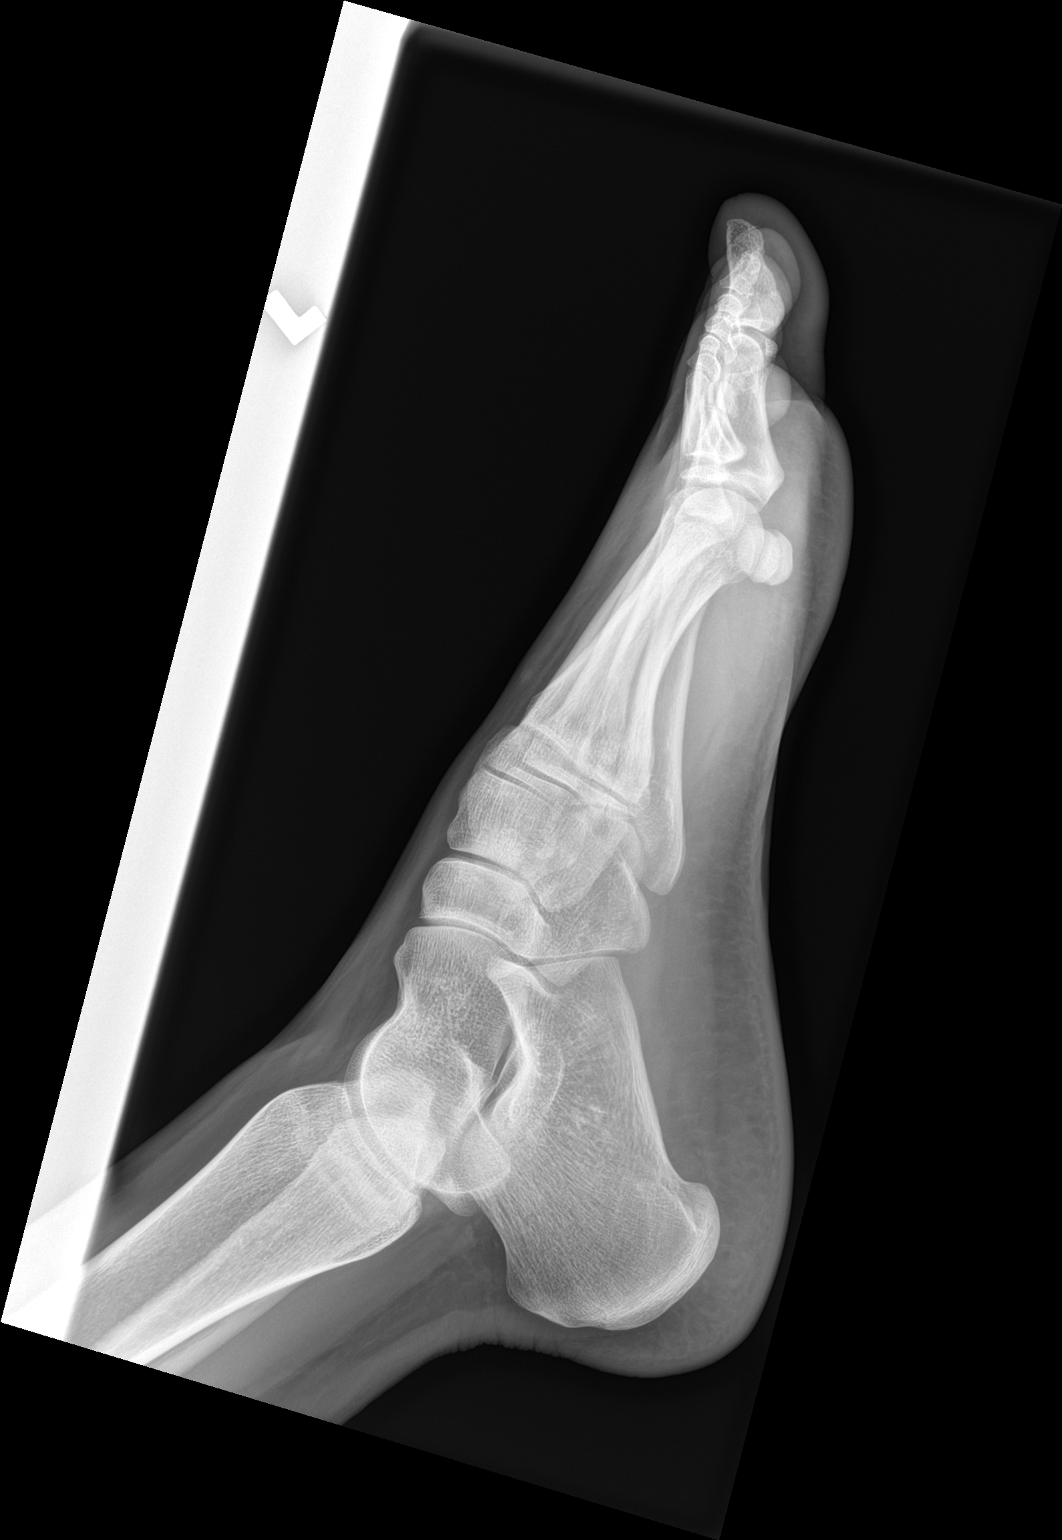

[foot lat (2 of 2)]
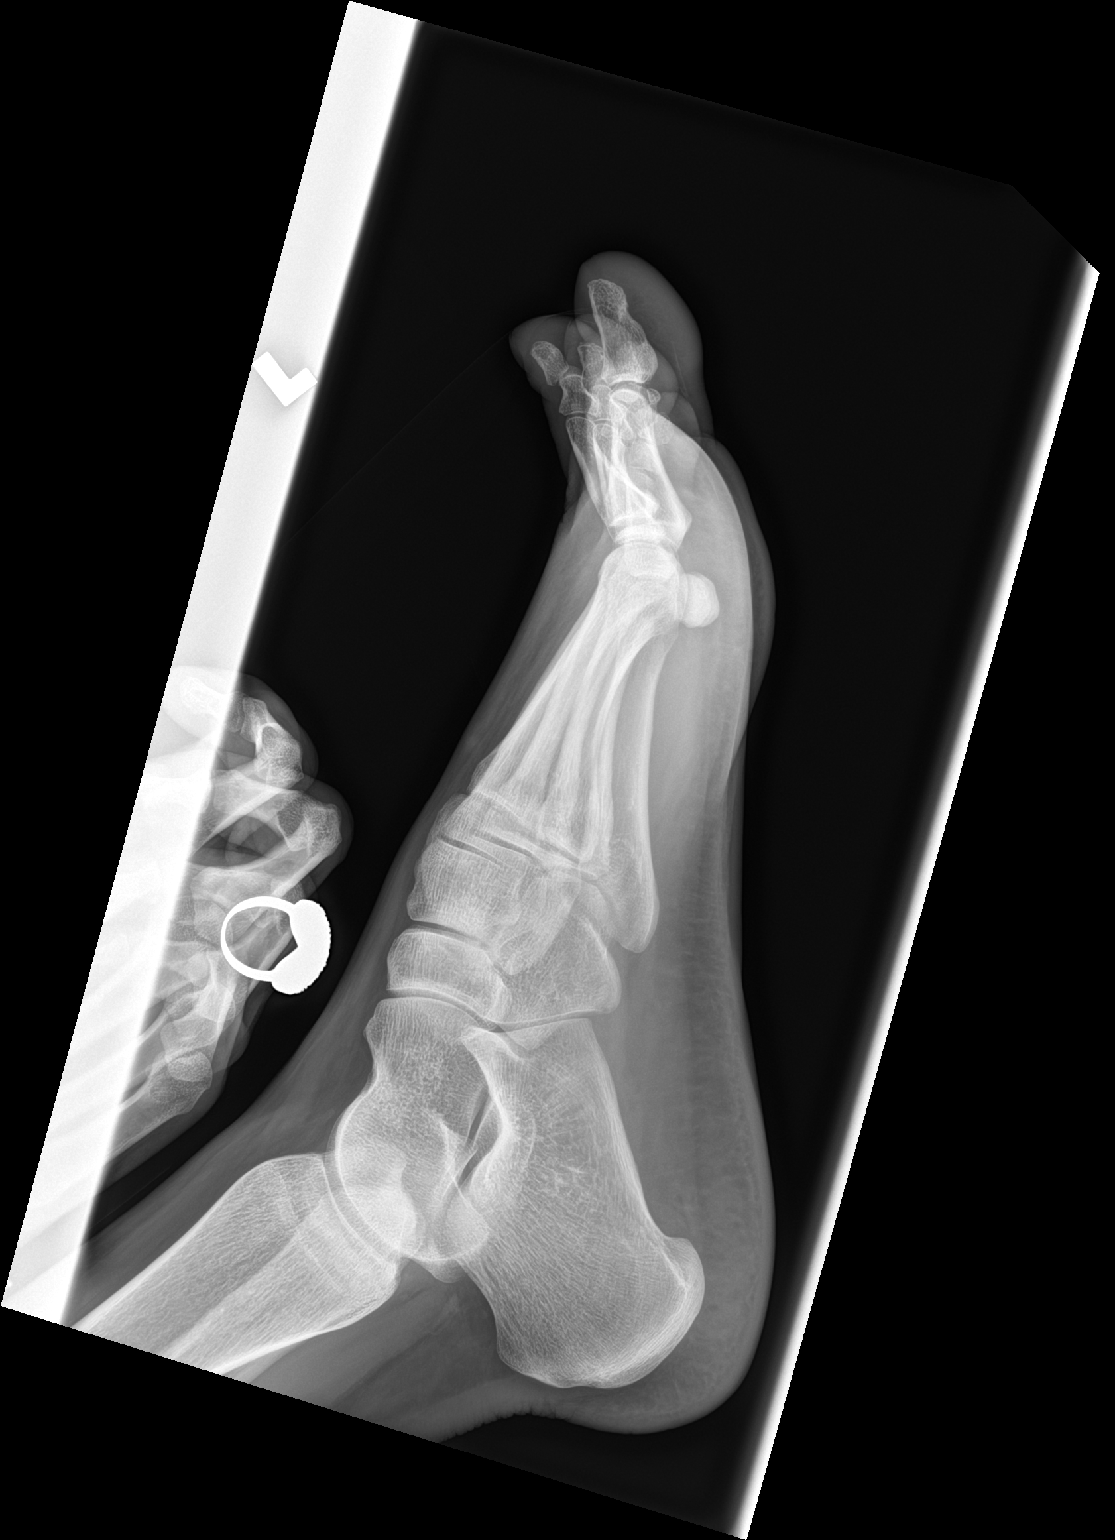

[4 of 4 positions shown; findings below may reference images not displayed]

FINDINGS: There is a minimally displaced fracture of the medial base the great
toe proximal phalanx that extends to the articular surface.
IMPRESSION: Minimally displaced intra-articular fracture of the medial base of
the great toe proximal phalanx.

## 2022-07-20 ENCOUNTER — Emergency Department (HOSPITAL_COMMUNITY): Payer: Commercial Managed Care - HMO

## 2022-07-20 ENCOUNTER — Emergency Department (HOSPITAL_COMMUNITY)
Admission: EM | Admit: 2022-07-20 | Discharge: 2022-07-21 | Disposition: A | Discharge status: Home / Self Care | Payer: Commercial Managed Care - HMO | Attending: Emergency Medicine | Admitting: Emergency Medicine

## 2022-07-20 DIAGNOSIS — W268XXA Contact with other sharp object(s), not elsewhere classified, initial encounter: Secondary | ICD-10-CM | POA: Diagnosis not present

## 2022-07-20 DIAGNOSIS — S60943A Unspecified superficial injury of left middle finger, initial encounter: Secondary | ICD-10-CM | POA: Diagnosis present

## 2022-07-20 DIAGNOSIS — S61213A Laceration without foreign body of left middle finger without damage to nail, initial encounter: Secondary | ICD-10-CM | POA: Insufficient documentation

## 2022-07-20 DIAGNOSIS — Z23 Encounter for immunization: Secondary | ICD-10-CM | POA: Insufficient documentation

## 2022-07-20 MED ORDER — TETANUS-DIPHTH-ACELL PERTUSSIS 5-2.5-18.5 LF-MCG/0.5 IM SUSY
0.5000 mL | PREFILLED_SYRINGE | Freq: Once | INTRAMUSCULAR | Status: AC
  Administered 2022-07-20: 0.5 mL via INTRAMUSCULAR
  Filled 2022-07-20: qty 0.5

## 2022-07-20 MED ORDER — HYDROCODONE-ACETAMINOPHEN 5-325 MG PO TABS
1.0000 | ORAL_TABLET | Freq: Once | ORAL | Status: AC
  Administered 2022-07-20: 1 via ORAL
  Filled 2022-07-20: qty 1

## 2022-07-20 NOTE — ED Triage Notes (Signed)
Pt to ED c/o left middle finger laceration that occurred aprox 3 hours ago, reports meal broom   and cut finger. Unknown last tetanus shot. Bleeding controlled in triage.

## 2022-07-20 NOTE — ED Provider Triage Note (Signed)
Emergency Medicine Provider Triage Evaluation Note  Caitlin Greer , a 36 y.o. female  was evaluated in triage.  Pt complains of L middle finger laceration from metal broom breaking. Unknown last tdap.  Review of Systems  Positive: wound Negative: neuodeficits  Physical Exam  BP (!) 115/91   Pulse 82   Temp 98 F (36.7 C) (Oral)   Resp 16   Ht '5\' 6"'$  (1.676 m)   Wt 63 kg   LMP 07/06/2022   SpO2 100%   BMI 22.44 kg/m  Gen:   Awake, no distress   Resp:  Normal effort  MSK:   Moves extremities without difficulty  Other:  1.5cm C shaped laceration to tip of L middle finger  Medical Decision Making  Medically screening exam initiated at 11:19 PM.  Appropriate orders placed.  Caitlin Greer was informed that the remainder of the evaluation will be completed by another provider, this initial triage assessment does not replace that evaluation, and the importance of remaining in the ED until their evaluation is complete.    Osvaldo Shipper, [place] 07/20/22 2320

## 2022-07-21 MED ORDER — LIDOCAINE HCL (PF) 1 % IJ SOLN
30.0000 mL | Freq: Once | INTRAMUSCULAR | Status: AC
  Administered 2022-07-21: 30 mL
  Filled 2022-07-21: qty 30

## 2022-07-21 NOTE — ED Notes (Signed)
Wrapped patients finger at this time with petroleum gauze and regular gauze.

## 2022-07-21 NOTE — Discharge Instructions (Signed)
Follow-up with your doctor or urgent care in 10 days to have the sutures removed.

## 2022-07-21 NOTE — ED Provider Notes (Signed)
[facility] EMERGENCY DEPARTMENT Provider Note   CSN: [id number] Arrival date & time: 07/20/22  2230     History  Chief Complaint  Patient presents with   Finger Injury    Left middle    Caitlin Greer is a 36 y.o. female.  Patient presents to the emergency department for evaluation of injury to left middle finger.  Patient reports that she cut her finger on a piece of metal.       Home Medications Prior to Admission medications   Medication Sig Start Date End Date Taking? Authorizing Provider  HYDROcodone-acetaminophen (NORCO) 5-325 MG tablet Take 1 tablet by mouth every 6 (six) hours as needed for severe pain. 05/30/21   Molpus, John, MD  naproxen (NAPROSYN) 375 MG tablet Take 1 tablet twice daily as needed for pain. 05/30/21   Molpus, John, MD  cetirizine (ZYRTEC ALLERGY) 10 MG tablet Take 1 tablet (10 mg total) by mouth daily. Patient not taking: Reported on 06/18/2016 05/18/16 03/12/20  Montine Circle, PA-C      Allergies    Patient has no known allergies.    Review of Systems   Review of Systems  Physical Exam Updated Vital Signs BP 120/83   Pulse 86   Temp 98.9 F (37.2 C)   Resp 17   Ht '5\' 6"'$  (1.676 m)   Wt 63 kg   LMP 07/06/2022   SpO2 95%   BMI 22.44 kg/m  Physical Exam Vitals and nursing note reviewed.  Constitutional:      Appearance: Normal appearance.  HENT:     Head: Atraumatic.  Musculoskeletal:     Left hand: Laceration (Ulnar aspect of distal third finger) present. No swelling. Normal strength. Normal sensation. There is no disruption of two-point discrimination. Normal capillary refill.  Skin:    Findings: Signs of injury (Left middle finger) present.  Neurological:     Mental Status: She is alert.     Sensory: Sensation is intact.     Motor: Motor function is intact.     ED Results / Procedures / Treatments   Labs (all labs ordered are listed, but only abnormal results are displayed) Labs Reviewed  I-STAT  BETA HCG BLOOD, ED ([facility], [facility], [facility] ONLY)    EKG None  Radiology DG Hand Complete Left  Result Date: 07/20/2022 CLINICAL DATA:  Finger laceration with metallic bloom. EXAM: LEFT HAND - COMPLETE 3+ VIEW COMPARISON:  None Available. FINDINGS: There is no evidence of fracture or dislocation. There is no evidence of arthropathy or other focal bone abnormality. Soft tissue swelling and irregularity about the distal phalanx of the middle finger. No radiopaque foreign body. Oft tissues are unremarkable. IMPRESSION: Soft tissue swelling and irregularity about the distal phalanx of the middle finger. No radiopaque foreign body. Electronically Signed   By: Keane Police D.O.   On: 07/20/2022 23:48    Procedures .Marland KitchenLaceration Repair  Date/Time: 07/21/2022 2:50 AM  Performed by: Orpah Greek, MD Authorized by: Orpah Greek, MD   Consent:    Consent obtained:  Verbal   Consent given by:  Patient   Risks, benefits, and alternatives were discussed: yes     Risks discussed:  Infection and pain Universal protocol:    Relevant documents present and verified: yes     Test results available: yes     Imaging studies available: yes     Required blood products, implants, devices, and special equipment available: yes     Site/side marked:  yes     Immediately prior to procedure, a time out was called: yes     Patient identity confirmed:  Verbally with patient Anesthesia:    Anesthesia method:  Nerve block   Block location:  Finger   Block anesthetic:  Lidocaine 1% WITH epi   Block technique:  Digital   Block injection procedure:  Anatomic landmarks identified, introduced needle, incremental injection, anatomic landmarks palpated and negative aspiration for blood   Block outcome:  Anesthesia achieved Laceration details:    Location:  Finger   Finger location:  L long finger   Length (cm):  1.5 Exploration:    Contaminated: no   Treatment:    Area cleansed with:  Chlorhexidine    Amount of cleaning:  Standard   Irrigation solution:  Sterile saline   Irrigation volume:  500   Irrigation method:  Syringe   Debridement:  None Skin repair:    Repair method:  Sutures   Suture size:  4-0   Suture material:  Prolene   Suture technique:  Simple interrupted   Number of sutures:  3 Approximation:    Approximation:  Close Repair type:    Repair type:  Simple Post-procedure details:    Dressing:  Non-adherent dressing   Procedure completion:  Tolerated well, no immediate complications     Medications Ordered in ED Medications  Tdap (BOOSTRIX) injection 0.5 mL (0.5 mLs Intramuscular Given 07/20/22 2332)  HYDROcodone-acetaminophen (NORCO/VICODIN) 5-325 MG per tablet 1 tablet (1 tablet Oral Given 07/20/22 2333)  lidocaine (PF) (XYLOCAINE) 1 % injection 30 mL (30 mLs Infiltration Given by Other 07/21/22 0235)    ED Course/ Medical Decision Making/ A&P                           Medical Decision Making Risk Prescription drug management.   Presents for laceration of left middle finger.  Patient had a decent sized flap on the ulnar aspect of the pad of the finger.  Edges of the flap were clearly devitalized, however the base and central portion of the flap had good blood flow and I recommended sutures to prevent continual dehiscence of the wound with minimal trauma.  Sutures were placed.  I fully expect the wound margins to heal by secondary intention when sutures are removed, but the majority of the flap will heal in place.        Final Clinical Impression(s) / ED Diagnoses Final diagnoses:  Laceration of left middle finger without foreign body without damage to nail, initial encounter    Rx / DC Orders ED Discharge Orders     None         Vibhav Waddill, Gwenyth Allegra, MD 07/21/22 [phone]

## 2023-06-12 ENCOUNTER — Other Ambulatory Visit: Payer: Self-pay

## 2023-06-12 ENCOUNTER — Emergency Department (HOSPITAL_COMMUNITY)
Admission: EM | Admit: 2023-06-12 | Discharge: 2023-06-13 | Disposition: A | Discharge status: Home / Self Care | Payer: Medicaid Other | Attending: Emergency Medicine | Admitting: Emergency Medicine

## 2023-06-12 ENCOUNTER — Encounter (HOSPITAL_COMMUNITY): Payer: Self-pay | Admitting: Emergency Medicine

## 2023-06-12 DIAGNOSIS — J45909 Unspecified asthma, uncomplicated: Secondary | ICD-10-CM | POA: Diagnosis not present

## 2023-06-12 DIAGNOSIS — R109 Unspecified abdominal pain: Secondary | ICD-10-CM | POA: Diagnosis not present

## 2023-06-12 DIAGNOSIS — R519 Headache, unspecified: Secondary | ICD-10-CM | POA: Diagnosis present

## 2023-06-12 DIAGNOSIS — M542 Cervicalgia: Secondary | ICD-10-CM | POA: Insufficient documentation

## 2023-06-12 DIAGNOSIS — Y92410 Unspecified street and highway as the place of occurrence of the external cause: Secondary | ICD-10-CM | POA: Diagnosis not present

## 2023-06-12 DIAGNOSIS — M545 Low back pain, unspecified: Secondary | ICD-10-CM | POA: Insufficient documentation

## 2023-06-12 NOTE — ED Notes (Signed)
Pt called and unable to be found in lobby. Pt will be called from lobby. Unable to get pt from peds att

## 2023-06-12 NOTE — ED Notes (Signed)
Patient in peds with her child.
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
## 2023-06-13 ENCOUNTER — Emergency Department (HOSPITAL_COMMUNITY): Payer: Medicaid Other

## 2023-06-13 MED ORDER — NAPROXEN 250 MG PO TABS
500.0000 mg | ORAL_TABLET | Freq: Once | ORAL | Status: AC
  Administered 2023-06-13: 500 mg via ORAL
  Filled 2023-06-13: qty 2

## 2023-06-13 MED ORDER — NAPROXEN 500 MG PO TABS: 500.0000 mg | ORAL_TABLET | Freq: Two times a day (BID) | ORAL | 0 refills | Status: DC

## 2023-06-13 NOTE — Discharge Instructions (Addendum)
You were evaluated in the Emergency Department and after careful evaluation, we did not find any emergent condition requiring admission or further testing in the hospital.  Your exam/testing today is overall reassuring.  Take the Naprosyn anti-inflammatory as directed for any lingering discomfort.  Follow-up with your primary care doctor.  Please return to the Emergency Department if you experience any worsening of your condition.   Thank you for allowing [place] to be a part of your care.
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
## 2023-11-08 ENCOUNTER — Emergency Department (HOSPITAL_COMMUNITY)
Admission: EM | Admit: 2023-11-08 | Discharge: 2023-11-09 | Discharge status: Against Medical Advice | Attending: Emergency Medicine | Admitting: Emergency Medicine

## 2023-11-08 ENCOUNTER — Other Ambulatory Visit: Payer: Self-pay

## 2023-11-08 ENCOUNTER — Encounter (HOSPITAL_COMMUNITY): Payer: Self-pay

## 2023-11-08 DIAGNOSIS — S0990XA Unspecified injury of head, initial encounter: Secondary | ICD-10-CM | POA: Diagnosis present

## 2023-11-08 DIAGNOSIS — Y92410 Unspecified street and highway as the place of occurrence of the external cause: Secondary | ICD-10-CM | POA: Diagnosis not present

## 2023-11-08 DIAGNOSIS — Z5321 Procedure and treatment not carried out due to patient leaving prior to being seen by health care provider: Secondary | ICD-10-CM | POA: Insufficient documentation
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
## 2023-11-09 ENCOUNTER — Encounter (HOSPITAL_COMMUNITY): Payer: Self-pay

## 2023-11-09 ENCOUNTER — Ambulatory Visit (HOSPITAL_COMMUNITY)

## 2023-11-09 ENCOUNTER — Ambulatory Visit (HOSPITAL_COMMUNITY)
Admission: EM | Admit: 2023-11-09 | Discharge: 2023-11-09 | Disposition: A | Discharge status: Home / Self Care | Attending: Physician Assistant | Admitting: Physician Assistant

## 2023-11-09 DIAGNOSIS — M25531 Pain in right wrist: Secondary | ICD-10-CM

## 2023-11-09 DIAGNOSIS — S0083XA Contusion of other part of head, initial encounter: Secondary | ICD-10-CM

## 2023-11-09 MED ORDER — CYCLOBENZAPRINE HCL 10 MG PO TABS: 10.0000 mg | ORAL_TABLET | Freq: Two times a day (BID) | ORAL | 0 refills | Status: DC | PRN

## 2023-11-09 MED ORDER — ACETAMINOPHEN 325 MG PO TABS
ORAL_TABLET | ORAL | Status: AC
  Filled 2023-11-09: qty 3

## 2023-11-09 MED ORDER — ACETAMINOPHEN 325 MG PO TABS
975.0000 mg | ORAL_TABLET | Freq: Once | ORAL | Status: AC
  Administered 2023-11-09: 975 mg via ORAL

## 2023-11-09 NOTE — ED Provider Notes (Signed)
 LWBS after triage. Was very briefly roomed in bed 17 in the ED, but eloped prior to my arrival to the bedside. I did not evaluate this patient.   This chart was dictated using voice recognition software, Dragon. Despite the best efforts of this provider to proofread and correct errors, errors may still occur which can change documentation meaning.    Amorah Sebring R, PA-C 11/09/23 0045    Palumbo, April, MD 11/09/23 [phone]
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
## 2023-11-09 NOTE — Discharge Instructions (Addendum)
 Can take Flexeril as needed for muscle spasm. Recommend Tylenol as needed for pain. Recommend gentle stretching and ice to affected areas. If you are having a worsening headache or new symptoms please go to the emergency department for evaluation.

## 2023-11-09 NOTE — ED Notes (Signed)
 Pt upset about the wait time and would like to speak to the charge RN. Pt was roomed in orange 17.

## 2024-02-23 ENCOUNTER — Ambulatory Visit (HOSPITAL_COMMUNITY): Admission: EM | Admit: 2024-02-23 | Discharge: 2024-02-23 | Disposition: A | Discharge status: Home / Self Care

## 2024-02-23 ENCOUNTER — Encounter (HOSPITAL_COMMUNITY): Payer: Self-pay | Admitting: Emergency Medicine

## 2024-02-23 ENCOUNTER — Other Ambulatory Visit: Payer: Self-pay

## 2024-02-23 DIAGNOSIS — R300 Dysuria: Secondary | ICD-10-CM | POA: Diagnosis not present

## 2024-02-23 LAB — POCT URINALYSIS DIP (MANUAL ENTRY)
Bilirubin, UA: NEGATIVE
Blood, UA: NEGATIVE
Glucose, UA: NEGATIVE mg/dL
Ketones, POC UA: NEGATIVE mg/dL
Nitrite, UA: NEGATIVE
Protein Ur, POC: NEGATIVE mg/dL
Spec Grav, UA: 1.01 (ref 1.010–1.025)
Urobilinogen, UA: 0.2 U/dL
pH, UA: 6 (ref 5.0–8.0)

## 2024-02-23 MED ORDER — METRONIDAZOLE 500 MG PO TABS: 500.0000 mg | ORAL_TABLET | Freq: Two times a day (BID) | ORAL | 0 refills | Status: DC
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
## 2024-02-23 NOTE — ED Triage Notes (Signed)
 Vaginal discharge and itchiness for the past 4 days. Pt denies any urinary symptoms at this time.
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
## 2024-02-24 LAB — CERVICOVAGINAL ANCILLARY ONLY
Bacterial Vaginitis (gardnerella): POSITIVE — AB
Candida Glabrata: NEGATIVE
Candida Vaginitis: POSITIVE — AB
Chlamydia: NEGATIVE
Comment: NEGATIVE
Comment: NEGATIVE
Comment: NEGATIVE
Comment: NEGATIVE
Comment: NEGATIVE
Comment: NORMAL
Neisseria Gonorrhea: NEGATIVE
Trichomonas: NEGATIVE

## 2024-02-25 ENCOUNTER — Ambulatory Visit: Payer: Self-pay

## 2024-02-25 LAB — URINE CULTURE: Culture: <10000 — AB

## 2024-02-25 MED ORDER — FLUCONAZOLE 150 MG PO TABS: 150.0000 mg | ORAL_TABLET | Freq: Every day | ORAL | 0 refills | Status: DC

## 2024-03-03 ENCOUNTER — Ambulatory Visit (HOSPITAL_COMMUNITY)
Admission: EM | Admit: 2024-03-03 | Discharge: 2024-03-03 | Disposition: A | Discharge status: Home / Self Care | Source: Intra-hospital

## 2024-03-03 ENCOUNTER — Encounter (HOSPITAL_COMMUNITY): Payer: Self-pay

## 2024-03-03 DIAGNOSIS — N760 Acute vaginitis: Secondary | ICD-10-CM | POA: Insufficient documentation

## 2024-03-03 DIAGNOSIS — B9689 Other specified bacterial agents as the cause of diseases classified elsewhere: Secondary | ICD-10-CM | POA: Diagnosis not present

## 2024-03-03 DIAGNOSIS — N898 Other specified noninflammatory disorders of vagina: Secondary | ICD-10-CM | POA: Diagnosis present

## 2024-03-03 MED ORDER — METRONIDAZOLE 500 MG PO TABS: 2000.0000 mg | ORAL_TABLET | Freq: Once | ORAL | 0 refills | Status: AC

## 2024-03-03 NOTE — ED Triage Notes (Signed)
 Patient presenting with foul vaginal discharge, right lower back pain with slight urinary frequency onset over a week. Patient was seen 02/23/24 and was positive for BV. Symptoms ongoing and wanting the PID testing done.  Prescriptions or OTC medications tried: Yes- antibiotics   with no relief.

## 2024-03-03 NOTE — Discharge Instructions (Signed)
  1. Vaginal discharge (Primary) - Mycoplasma genitalium, NAA, Swab collected in UC and sent to the lab for further testing results should be available in 2 to 3 days.  2. Bacterial vaginosis - metroNIDAZOLE  (FLAGYL ) 500 MG tablet; Take 4 tablets (2,000 mg total) by mouth once for 1 dose for treatment of sexual partner for recurrent BV infections - Ambulatory referral to Obstetrics / Gynecology follow-up evaluation and treatment of ongoing recurrent BV infections.

## 2024-03-03 NOTE — ED Provider Notes (Signed)
 [facility]  Note:  This document was prepared using Dragon voice recognition software and may include unintentional dictation errors.  MRN: [id number] DOB: Dec 13, 1985  Subjective:   Caitlin Greer is a 38 y.o. female presenting for ongoing vaginal discharge and vaginal odor since 02/23/2024.  Patient was initially seen here in the clinic swabbed and came back positive for bacterial vaginosis and yeast infection.  Patient is still taking prescribed medications for therapy but is still having discharge and was concerned for other possible vaginal infections.  Patient also has some concern for ongoing infection due to possible reinfection from her boyfriend.  Patient requesting partner therapy with metronidazole  to prevent recurrent infection.  Denies any dysuria, increased urinary frequency, severe abdominal pain or pressure, flank pain.  No current facility-administered medications for this encounter.  Current Outpatient Medications:    fluconazole  (DIFLUCAN ) 150 MG tablet, Take 1 tablet (150 mg total) by mouth daily. Take one pill by mouth on day one, take second pill on day 4 if still having symptoms., Disp: 2 tablet, Rfl: 0   metroNIDAZOLE  (FLAGYL ) 500 MG tablet, Take 4 tablets (2,000 mg total) by mouth once for 1 dose., Disp: 4 tablet, Rfl: 0   No Known Allergies  Past Medical History:  Diagnosis Date   Asthma    managed with rescue inhaler only; last used 8 months ago   Eczema    Fibroid 2016   uterus   Infection 2015   UTI; was treated   Pregnancy induced hypertension      Past Surgical History:  Procedure Laterality Date   CESAREAN SECTION  2006   classical vertical skin incision; transverse incision on uterus   CESAREAN SECTION N/A 05/11/2015   Procedure: CESAREAN SECTION;  Surgeon: Aida DELENA Na, MD;  Location: [facility];  Service: Obstetrics;  Laterality: N/A;   THERAPEUTIC ABORTION      Family History  Adopted: Yes    Social History   Tobacco  Use   Smoking status: Never   Smokeless tobacco: Never  Substance Use Topics   Alcohol use: No    Comment: occ   Drug use: No    ROS Refer to HPI for ROS details.  Objective:   Vitals: BP (!) 135/95 (BP Location: Right Arm)   Pulse 73   Temp 99.6 F (37.6 C) (Oral)   Resp 18   LMP 01/23/2024 (Exact Date)   SpO2 93%   Physical Exam Vitals and nursing note reviewed.  Constitutional:      General: She is not in acute distress.    Appearance: Normal appearance. She is well-developed. She is not ill-appearing or toxic-appearing.  HENT:     Head: Normocephalic and atraumatic.  Cardiovascular:     Rate and Rhythm: Normal rate.  Pulmonary:     Effort: Pulmonary effort is normal. No respiratory distress.  Abdominal:     General: There is no distension.     Palpations: Abdomen is soft.     Tenderness: There is no abdominal tenderness. There is no right CVA tenderness or left CVA tenderness.  Genitourinary:    Vagina: Vaginal discharge present.  Skin:    General: Skin is warm and dry.  Neurological:     General: No focal deficit present.     Mental Status: She is alert and oriented to person, place, and time.  Psychiatric:        Mood and Affect: Mood normal.        Behavior: Behavior normal.  Procedures  No results found for this or any previous visit (from the past 24 hours).  No results found.   Assessment and Plan :     Discharge Instructions       1. Vaginal discharge (Primary) - Mycoplasma genitalium, NAA, Swab collected in UC and sent to the lab for further testing results should be available in 2 to 3 days.  2. Bacterial vaginosis - metroNIDAZOLE  (FLAGYL ) 500 MG tablet; Take 4 tablets (2,000 mg total) by mouth once for 1 dose for treatment of sexual partner for recurrent BV infections - Ambulatory referral to Obstetrics / Gynecology follow-up evaluation and treatment of ongoing recurrent BV infections.      Yentl Verge B Germaine Shenker   Jacksyn Beeks,  [place], [place] 03/03/24 1524

## 2024-03-16 LAB — MISC LABCORP TEST (SEND OUT): Labcorp test code: 180076

## 2024-04-13 ENCOUNTER — Telehealth: Admitting: Physician Assistant

## 2024-04-13 DIAGNOSIS — Z5321 Procedure and treatment not carried out due to patient leaving prior to being seen by health care provider: Secondary | ICD-10-CM

## 2024-04-13 NOTE — Progress Notes (Signed)
The patient no-showed for appointment despite this provider sending direct link with no response and waiting for at least 10 minutes from appointment time for patient to join. They will be marked as a NS for this appointment/time.   Caitlin Greer M Kenzie Flakes, PA-C    

## 2024-04-14 ENCOUNTER — Telehealth: Admitting: Physician Assistant

## 2024-04-14 DIAGNOSIS — N760 Acute vaginitis: Secondary | ICD-10-CM

## 2024-04-14 MED ORDER — METRONIDAZOLE 0.75 % VA GEL: 1.0000 | Freq: Every day | VAGINAL | 0 refills | Status: DC

## 2024-04-14 NOTE — Patient Instructions (Signed)
  Elyn GORMAN Limes, thank you for joining Caitlin Velma Lunger, PA-C for today's virtual visit.  While this provider is not your primary care provider (PCP), if your PCP is located in our provider database this encounter information will be shared with them immediately following your visit.   A [facility] MyChart account gives you access to today's visit and all your visits, tests, and labs performed at [facility]  click here if you don't have a [facility] MyChart account or go to [email or web address]  Consent: (Patient) Caitlin Greer provided verbal consent for this virtual visit at the beginning of the encounter.  Current Medications:  Current Outpatient Medications:    fluconazole  (DIFLUCAN ) 150 MG tablet, Take 1 tablet (150 mg total) by mouth daily. Take one pill by mouth on day one, take second pill on day 4 if still having symptoms., Disp: 2 tablet, Rfl: 0   Medications ordered in this encounter:  No orders of the defined types were placed in this encounter.    *If you need refills on other medications prior to your next appointment, please contact your pharmacy*  Follow-Up: Call back or seek an in-person evaluation if the symptoms worsen or if the condition fails to improve as anticipated.  [facility] [phone]  Other Instructions Please use the Metrogel  as directed to help resolve symptoms. At this point, I am concerned that partner may be a carrier giving your recurring infections after intercourse. Would recommend he speak with his PCP about treatment (in men, usually a course of Flagyl  and a topical medicine are given).   If you note any non-resolving, new, or worsening symptoms despite treatment, please seek an in-person evaluation ASAP.    If you have been instructed to have an in-person evaluation today at a local Urgent Care facility, please use the link below. It will take you to a list of all of our available [facility], including address, phone number and hours of operation. Please do not delay care.  [facility]  If you or a family member do not have a primary care provider, use the link below to schedule a visit and establish care. When you choose a [facility] primary care physician or advanced practice provider, you gain a long-term partner in health. Find a Primary Care Provider  Learn more about [facility]'s in-office and virtual care options: [facility] - Get Care Now

## 2024-04-14 NOTE — Progress Notes (Signed)
 Virtual Visit Consent   Caitlin Greer, you are scheduled for a virtual visit with a [facility] provider today. Just as with appointments in the office, your consent must be obtained to participate. Your consent will be active for this visit and any virtual visit you may have with one of our providers in the next 365 days. If you have a MyChart account, a copy of this consent can be sent to you electronically.  As this is a virtual visit, video technology does not allow for your provider to perform a traditional examination. This may limit your provider's ability to fully assess your condition. If your provider identifies any concerns that need to be evaluated in person or the need to arrange testing (such as labs, EKG, etc.), we will make arrangements to do so. Although advances in technology are sophisticated, we cannot ensure that it will always work on either your end or our end. If the connection with a video visit is poor, the visit may have to be switched to a telephone visit. With either a video or telephone visit, we are not always able to ensure that we have a secure connection.  By engaging in this virtual visit, you consent to the provision of healthcare and authorize for your insurance to be billed (if applicable) for the services provided during this visit. Depending on your insurance coverage, you may receive a charge related to this service.  I need to obtain your verbal consent now. Are you willing to proceed with your visit today? Caitlin Greer has provided verbal consent on 04/14/2024 for a virtual visit (video or telephone). Elsie Velma Lunger, [place]  Date: 04/14/2024 4:12 PM   Virtual Visit via Video Note   I, Elsie Velma Lunger, connected with  Caitlin Greer  ([id number], 1985/11/13) on 04/14/24 at  4:00 PM EDT by a video-enabled telemedicine application and verified that I am speaking with the correct person using two identifiers.  Location: Patient: Virtual Visit  Location Patient: Home Provider: Virtual Visit Location Provider: Home Office   I discussed the limitations of evaluation and management by telemedicine and the availability of in person appointments. The patient expressed understanding and agreed to proceed.    History of Present Illness: Caitlin Greer is a 38 y.o. who identifies as a female who was assigned female at birth, and is being seen today for concern about a recurrence of BV. Notes she was diagnosed and treated for this and a yeast infection last month, given a course of Diflucan  and a one dose (4 tablets) of Flagyl . Notes complete resolution of symptoms at that time.  Was active with her significant other in the past week, with recurrence of vaginal odor and discharge starting shortly after. Notes due to having this issue more recurring now, it was recommended that her partner be treated as well, which has not happened. Denies change to any soaps, lotions or feminine hygiene products.   HPI: HPI  Problems:  Patient Active Problem List   Diagnosis Date Noted   S/P cesarean section 05/11/2015   Preterm labor 04/26/2015   NVD (normal vaginal delivery) 06/08/2014   Active bleeding 06/07/2014    Allergies: No Known Allergies Medications: No current outpatient medications on file.  Observations/Objective: Patient is well-developed, well-nourished in no acute distress.  Resting comfortably  at home.  Head is normocephalic, atraumatic.  No labored breathing.  Speech is clear and coherent with logical content.  Patient is alert and oriented at baseline.  Assessment and Plan: There are no diagnoses linked to this encounter. Supportive measures and OTC medications reviewed. Discussed [place] options. She elects for Metrogel . Rx sent to pharmacy. Do recommend partner be evaluated and receive treatment per most recent up-to-date guidelines to help see if this prevents recurring infections for her. In person follow-up precautions reviewed.    Follow Up Instructions: I discussed the assessment and treatment plan with the patient. The patient was provided an opportunity to ask questions and all were answered. The patient agreed with the plan and demonstrated an understanding of the instructions.  A copy of instructions were sent to the patient via MyChart unless otherwise noted below.   The patient was advised to call back or seek an in-person evaluation if the symptoms worsen or if the condition fails to improve as anticipated.    Elsie Velma Lunger, PA-C

## 2024-04-15 ENCOUNTER — Ambulatory Visit (HOSPITAL_COMMUNITY): Payer: Self-pay

## 2024-06-15 ENCOUNTER — Encounter (HOSPITAL_COMMUNITY): Payer: Self-pay | Admitting: Emergency Medicine

## 2024-06-15 ENCOUNTER — Ambulatory Visit (HOSPITAL_COMMUNITY): Admission: EM | Admit: 2024-06-15 | Discharge: 2024-06-15 | Disposition: A | Discharge status: Home / Self Care

## 2024-06-15 DIAGNOSIS — H11421 Conjunctival edema, right eye: Secondary | ICD-10-CM

## 2024-06-15 DIAGNOSIS — R030 Elevated blood-pressure reading, without diagnosis of hypertension: Secondary | ICD-10-CM | POA: Diagnosis not present

## 2024-06-15 MED ORDER — CETIRIZINE HCL 10 MG PO TABS
10.0000 mg | ORAL_TABLET | Freq: Every day | ORAL | Status: DC
  Administered 2024-06-15: 10 mg via ORAL

## 2024-06-15 MED ORDER — CETIRIZINE HCL 10 MG PO TABS
ORAL_TABLET | ORAL | Status: AC
  Filled 2024-06-15: qty 1

## 2024-06-15 MED ORDER — AZELASTINE HCL 0.05 % OP SOLN: 1.0000 [drp] | Freq: Two times a day (BID) | OPHTHALMIC | 0 refills | Status: DC | PRN

## 2024-06-15 MED ORDER — AZELASTINE HCL 0.05 % OP SOLN: 1.0000 [drp] | Freq: Two times a day (BID) | OPHTHALMIC | 0 refills | Status: AC | PRN

## 2024-06-15 NOTE — ED Triage Notes (Signed)
 Pt reports about 45 minutes ago after handling some shrimp started having right eye swelling and watering. Denies visual impairment.

## 2024-06-15 NOTE — ED Provider Notes (Signed)
 [facility]    CSN: [id number] Arrival date & time: 06/15/24  1722      History   Chief Complaint Chief Complaint  Patient presents with   Facial Swelling    HPI Caitlin Greer is a 38 y.o. female.   Caitlin Greer presents today with complaint of right eye swelling that began 45 minutes ago.  She reports that she was handling shrimp and then rubbed her right eye.  She reports that I immediately began to swell.  She then washed her hands and rinsed out her eye, and has noticed some mild improvement.  She reports that the right eye is not itchy or painful.  She reports some mild blurred vision without photophobia, diplopia, or significant discharge.  She reports that the inside of her nose feels slightly itchy, but she denies nasal congestion, rhinorrhea, sneezing, sore throat, difficulty swallowing/breathing, shortness of breath, nausea, and vomiting.  She has not taken anything for her symptoms.  She denies any known allergies, and states that this has not happened in the past  The history is provided by the patient. No language interpreter was used.    Past Medical History:  Diagnosis Date   Asthma    managed with rescue inhaler only; last used 8 months ago   Eczema    Fibroid 2016   uterus   Infection 2015   UTI; was treated   Pregnancy induced hypertension     Patient Active Problem List   Diagnosis Date Noted   S/P cesarean section 05/11/2015   Preterm labor 04/26/2015   NVD (normal vaginal delivery) 06/08/2014   Active bleeding 06/07/2014    Past Surgical History:  Procedure Laterality Date   CESAREAN SECTION  2006   classical vertical skin incision; transverse incision on uterus   CESAREAN SECTION N/A 05/11/2015   Procedure: CESAREAN SECTION;  Surgeon: Aida DELENA Na, MD;  Location: [facility];  Service: Obstetrics;  Laterality: N/A;   THERAPEUTIC ABORTION      OB History     Gravida  6   Para  3   Term  2   Preterm  1   AB  2   Living  2       SAB  1   IAB  1   Ectopic  0   Multiple  0   Live Births  3            Home Medications    Prior to Admission medications   Medication Sig Start Date End Date Taking? Authorizing Provider  azelastine (OPTIVAR) 0.05 % ophthalmic solution Place 1 drop into the right eye 2 (two) times daily as needed (eye itching/swelling). 06/15/24   Leatrice Vernell HERO, NP  cetirizine  (ZYRTEC  ALLERGY) 10 MG tablet Take 1 tablet (10 mg total) by mouth daily. Patient not taking: Reported on 06/18/2016 05/18/16 03/12/20  Vicky Charleston, PA-C    Family History Family History  Adopted: Yes    Social History Social History   Tobacco Use   Smoking status: Never   Smokeless tobacco: Never  Substance Use Topics   Alcohol use: No    Comment: occ   Drug use: No     Allergies   Patient has no known allergies.   Review of Systems Review of Systems  Constitutional: Negative.   HENT:  Negative for congestion, rhinorrhea, sneezing, sore throat and trouble swallowing.   Eyes:  Positive for discharge. Negative for photophobia, pain, redness, itching and visual disturbance.  Respiratory:  Positive for chest tightness. Negative for cough, shortness of breath and wheezing.   Cardiovascular:  Negative for chest pain.  Gastrointestinal:  Negative for nausea and vomiting.  Allergic/Immunologic: Negative for environmental allergies, food allergies and immunocompromised state.  Neurological:  Negative for dizziness.     Physical Exam Triage Vital Signs ED Triage Vitals  Encounter Vitals Group     BP 06/15/24 1746 (!) 140/105     Girls Systolic BP Percentile --      Girls Diastolic BP Percentile --      Boys Systolic BP Percentile --      Boys Diastolic BP Percentile --      Pulse Rate 06/15/24 1746 76     Resp 06/15/24 1746 15     Temp 06/15/24 1746 98.8 F (37.1 C)     Temp Source 06/15/24 1746 Oral     SpO2 06/15/24 1746 98 %     Weight --      Height --      Head  Circumference --      Peak Flow --      Pain Score 06/15/24 1745 0     Pain Loc --      Pain Education --      Exclude from Growth Chart --    No data found.  Updated Vital Signs BP (!) 146/103 (BP Location: Right Arm)   Pulse 76   Temp 98.8 F (37.1 C) (Oral)   Resp 15   LMP 06/12/2024 (Exact Date)   SpO2 98%   Visual Acuity Right Eye Distance: 20/25 Left Eye Distance: 20/25 Bilateral Distance: 20/25  Right Eye Near:   Left Eye Near:    Bilateral Near:     Physical Exam Vitals and nursing note reviewed.  Constitutional:      General: She is not in acute distress.    Appearance: Normal appearance. She is normal weight. She is not toxic-appearing.  HENT:     Head: Normocephalic. No right periorbital erythema or left periorbital erythema.     Nose: Nose normal. No congestion or rhinorrhea.     Mouth/Throat:     Mouth: Mucous membranes are moist.     Pharynx: Oropharynx is clear. No oropharyngeal exudate or posterior oropharyngeal erythema.  Eyes:     General: Lids are normal. No allergic shiner, visual field deficit or scleral icterus.       Right eye: No foreign body, discharge or hordeolum.        Left eye: No foreign body, discharge or hordeolum.     Extraocular Movements:     Right eye: Normal extraocular motion and no nystagmus.     Left eye: Normal extraocular motion and no nystagmus.     Conjunctiva/sclera:     Right eye: Right conjunctiva is not injected. Chemosis present. No exudate or hemorrhage.    Left eye: Left conjunctiva is not injected. No exudate or hemorrhage.    Comments: Right bulbar conjunctiva with moderate chemosis to the inferior and lateral aspect. Scant watery discharge noted.   Cardiovascular:     Rate and Rhythm: Normal rate and regular rhythm.     Heart sounds: Normal heart sounds.  Pulmonary:     Effort: Pulmonary effort is normal.     Breath sounds: Normal breath sounds.  Musculoskeletal:     Cervical back: Neck supple.  Skin:     General: Skin is warm and dry.  Neurological:     Mental Status: She is alert.  Psychiatric:        Mood and Affect: Mood normal.        Behavior: Behavior normal.      UC Treatments / Results  Labs (all labs ordered are listed, but only abnormal results are displayed) Labs Reviewed - No data to display  EKG   Radiology No results found.  Procedures Procedures (including critical care time)  Medications Ordered in UC Medications  cetirizine  (ZYRTEC ) tablet 10 mg (10 mg Oral Given 06/15/24 1810)    Initial Impression / Assessment and Plan / UC Course  I have reviewed the triage vital signs and the nursing notes.  Pertinent labs & imaging results that were available during my care of the patient were reviewed by me and considered in my medical decision making (see chart for details).     Impression: Allergic/irritant chemosis of right eye.  Zyrtec  given in clinic and azelastine drops prescribed.  Discussed return precautions with the patient.  She verbalized understanding of all information provided during the visit Final Clinical Impressions(s) / UC Diagnoses   Final diagnoses:  Chemosis of conjunctiva, right  Elevated blood pressure reading without diagnosis of hypertension     Discharge Instructions        Diagnosis: Chemosis (swelling) of the conjunctiva, likely due to allergic or irritant contact.  Medications & Treatment:  - Zyrtec  (cetirizine ): Given in clinic for allergy relief. - Azelastine ophthalmic drops: Use 1-2 drops in the affected eye(s) twice daily as needed for itching, redness, or swelling. - Cold compresses: Apply to the closed eye(s) several times a day to reduce swelling and discomfort.  General Care Instructions:  - Avoid rubbing or touching the eyes. - Wash hands frequently. - Avoid known allergens or irritants if possible. - Do not use contact lenses until symptoms have fully resolved.  Emergency Department (ED)  Precautions: Seek immediate medical attention if you experience any of the following:  - Sudden vision changes (blurring, loss of vision, double vision) - Severe eye pain - Increasing redness or swelling - Discharge from the eye (especially if thick, yellow, or green) - Eyelid swelling that worsens or closes the eye - Signs of anaphylaxis: difficulty breathing, swelling of lips/tongue/throat, hives, dizziness, or fainting  Follow-Up:  - If symptoms do not improve within a few days, or if they worsen, schedule a follow-up appointment with your PCP or ophthalmology    What is Elevated BP? Elevated blood pressure means your blood pressure readings are higher than normal, but not yet in the range of hypertension. This increases your risk for heart disease, stroke, and other health problems over time.  Lifestyle Management Tips:  - Diet:    - Reduce salt (sodium) intake.   - Eat more fruits, vegetables, whole grains, and lean proteins (DASH diet). - Physical Activity:    - Aim for at least 30 minutes of moderate exercise (like brisk walking) most days of the week. - Weight Management:    - Achieve and maintain a healthy weight. - Limit Alcohol:    - Drink in moderation (no more than 1 drink/day for women, 2 for men).    - Practice relaxation techniques such as deep breathing, meditation, or yoga.  Follow-Up with PCP:  - Schedule a follow-up appointment with your primary care provider to monitor your blood pressure and assess your progress. - Bring a record of your home BP readings if possible. - Discuss any symptoms or side effects from medications or lifestyle changes.  ED Prescriptions     Medication Sig Dispense Auth. Provider   azelastine (OPTIVAR) 0.05 % ophthalmic solution  (Status: Discontinued) Place 1 drop into the left eye 2 (two) times daily as needed (eye itching/swelling). 6 mL Leatrice Vernell HERO, NP   azelastine (OPTIVAR) 0.05 % ophthalmic solution  Place 1 drop into the right eye 2 (two) times daily as needed (eye itching/swelling). 6 mL Leatrice Vernell HERO, NP      PDMP not reviewed this encounter.   Leatrice Vernell HERO, NP 06/15/24 1818

## 2024-06-15 NOTE — Discharge Instructions (Addendum)
   Diagnosis: Chemosis (swelling) of the conjunctiva, likely due to allergic or irritant contact.  Medications & Treatment:  - Zyrtec  (cetirizine ): Given in clinic for allergy relief. - Azelastine ophthalmic drops: Use 1-2 drops in the affected eye(s) twice daily as needed for itching, redness, or swelling. - Cold compresses: Apply to the closed eye(s) several times a day to reduce swelling and discomfort.  General Care Instructions:  - Avoid rubbing or touching the eyes. - Wash hands frequently. - Avoid known allergens or irritants if possible. - Do not use contact lenses until symptoms have fully resolved.  Emergency Department (ED) Precautions: Seek immediate medical attention if you experience any of the following:  - Sudden vision changes (blurring, loss of vision, double vision) - Severe eye pain - Increasing redness or swelling - Discharge from the eye (especially if thick, yellow, or green) - Eyelid swelling that worsens or closes the eye - Signs of anaphylaxis: difficulty breathing, swelling of lips/tongue/throat, hives, dizziness, or fainting  Follow-Up:  - If symptoms do not improve within a few days, or if they worsen, schedule a follow-up appointment with your PCP or ophthalmology    What is Elevated BP? Elevated blood pressure means your blood pressure readings are higher than normal, but not yet in the range of hypertension. This increases your risk for heart disease, stroke, and other health problems over time.  Lifestyle Management Tips:  - Diet:    - Reduce salt (sodium) intake.   - Eat more fruits, vegetables, whole grains, and lean proteins (DASH diet). - Physical Activity:    - Aim for at least 30 minutes of moderate exercise (like brisk walking) most days of the week. - Weight Management:    - Achieve and maintain a healthy weight. - Limit Alcohol:    - Drink in moderation (no more than 1 drink/day for women, 2 for men).    - Practice  relaxation techniques such as deep breathing, meditation, or yoga.  Follow-Up with PCP:  - Schedule a follow-up appointment with your primary care provider to monitor your blood pressure and assess your progress. - Bring a record of your home BP readings if possible. - Discuss any symptoms or side effects from medications or lifestyle changes.

## 2024-06-18 ENCOUNTER — Encounter (HOSPITAL_COMMUNITY): Payer: Self-pay

## 2024-06-18 ENCOUNTER — Ambulatory Visit (HOSPITAL_COMMUNITY)
Admission: EM | Admit: 2024-06-18 | Discharge: 2024-06-18 | Disposition: A | Discharge status: Home / Self Care | Attending: Emergency Medicine | Admitting: Emergency Medicine

## 2024-06-18 DIAGNOSIS — Z202 Contact with and (suspected) exposure to infections with a predominantly sexual mode of transmission: Secondary | ICD-10-CM | POA: Insufficient documentation

## 2024-06-18 DIAGNOSIS — Z113 Encounter for screening for infections with a predominantly sexual mode of transmission: Secondary | ICD-10-CM | POA: Diagnosis present

## 2024-06-18 DIAGNOSIS — Z3202 Encounter for pregnancy test, result negative: Secondary | ICD-10-CM | POA: Insufficient documentation

## 2024-06-18 LAB — POCT URINE DIPSTICK
Bilirubin, UA: NEGATIVE
Blood, UA: NEGATIVE
Glucose, UA: NEGATIVE mg/dL
Leukocytes, UA: NEGATIVE
Nitrite, UA: NEGATIVE
Spec Grav, UA: 1.025 (ref 1.010–1.025)
Urobilinogen, UA: 1 U/dL
pH, UA: 5.5 (ref 5.0–8.0)

## 2024-06-18 LAB — POCT URINE PREGNANCY: Preg Test, Ur: NEGATIVE

## 2024-06-18 MED ORDER — DOXYCYCLINE HYCLATE 100 MG PO TABS: 100.0000 mg | ORAL_TABLET | Freq: Two times a day (BID) | ORAL | 0 refills | Status: AC

## 2024-06-18 MED ORDER — CEFTRIAXONE SODIUM 500 MG IJ SOLR
500.0000 mg | INTRAMUSCULAR | Status: DC
  Administered 2024-06-18: 500 mg via INTRAMUSCULAR

## 2024-06-18 MED ORDER — LIDOCAINE HCL (PF) 1 % IJ SOLN
INTRAMUSCULAR | Status: AC
Start: 2024-06-18 — End: 2024-06-18
  Filled 2024-06-18: qty 2

## 2024-06-18 MED ORDER — CEFTRIAXONE SODIUM 500 MG IJ SOLR
INTRAMUSCULAR | Status: AC
  Filled 2024-06-18: qty 500

## 2024-06-18 NOTE — ED Provider Notes (Signed)
 [facility]    CSN: [id number] Arrival date & time: 06/18/24  1712      History   Chief Complaint Chief Complaint  Patient presents with   Exposure to STD   Groin Pain    HPI Caitlin Greer is a 38 y.o. female.   Patient presents to clinic over concern of exposure to sexually transmitted infection.  A sexual partner that she was last active with without condom or barrier method last week told her that he tested positive for gonorrhea and chlamydia.  Patient herself has not had any symptoms.  She was notified of this news yesterday and noticed that she had some vaginal discomfort today.  A little bit of dysuria.  In August patient had negative mycoplasma results.  End of July patient negative for gonorrhea, chlamydia and trichomoniasis.  Does have a history of BV and yeast.    The history is provided by the patient and medical records.  Exposure to STD  Groin Pain    Past Medical History:  Diagnosis Date   Asthma    managed with rescue inhaler only; last used 8 months ago   Eczema    Fibroid 2016   uterus   Infection 2015   UTI; was treated   Pregnancy induced hypertension     Patient Active Problem List   Diagnosis Date Noted   S/P cesarean section 05/11/2015   Preterm labor 04/26/2015   NVD (normal vaginal delivery) 06/08/2014   Active bleeding 06/07/2014    Past Surgical History:  Procedure Laterality Date   CESAREAN SECTION  2006   classical vertical skin incision; transverse incision on uterus   CESAREAN SECTION N/A 05/11/2015   Procedure: CESAREAN SECTION;  Surgeon: Aida DELENA Na, MD;  Location: [facility];  Service: Obstetrics;  Laterality: N/A;   THERAPEUTIC ABORTION      OB History     Gravida  6   Para  3   Term  2   Preterm  1   AB  2   Living  2      SAB  1   IAB  1   Ectopic  0   Multiple  0   Live Births  3            Home Medications    Prior to Admission medications   Medication Sig Start  Date End Date Taking? Authorizing Provider  azelastine  (OPTIVAR ) 0.05 % ophthalmic solution Place 1 drop into the right eye 2 (two) times daily as needed (eye itching/swelling). 06/15/24  Yes Leatrice Vernell HERO, NP  doxycycline  (VIBRA -TABS) 100 MG tablet Take 1 tablet (100 mg total) by mouth 2 (two) times daily for 7 days. 06/18/24 06/25/24 Yes Fitz Matsuo  N, FNP  cetirizine  (ZYRTEC  ALLERGY) 10 MG tablet Take 1 tablet (10 mg total) by mouth daily. Patient not taking: Reported on 06/18/2016 05/18/16 03/12/20  Vicky Charleston, PA-C    Family History Family History  Adopted: Yes    Social History Social History   Tobacco Use   Smoking status: Never   Smokeless tobacco: Never  Substance Use Topics   Alcohol use: No    Comment: occ   Drug use: No     Allergies   Patient has no known allergies.   Review of Systems Review of Systems  Per HPI  Physical Exam Triage Vital Signs ED Triage Vitals  Encounter Vitals Group     BP 06/18/24 1821 (!) 138/96     Girls  Systolic BP Percentile --      Girls Diastolic BP Percentile --      Boys Systolic BP Percentile --      Boys Diastolic BP Percentile --      Pulse Rate 06/18/24 1821 85     Resp 06/18/24 1821 16     Temp 06/18/24 1821 97.9 F (36.6 C)     Temp Source 06/18/24 1821 Oral     SpO2 06/18/24 1821 95 %     Weight --      Height 06/18/24 1821 5' 7 (1.702 m)     Head Circumference --      Peak Flow --      Pain Score 06/18/24 1820 3     Pain Loc --      Pain Education --      Exclude from Growth Chart --    No data found.  Updated Vital Signs BP (!) 138/96 (BP Location: Left Arm)   Pulse 85   Temp 97.9 F (36.6 C) (Oral)   Resp 16   Ht 5' 7 (1.702 m)   LMP 06/11/2024 (Exact Date)   SpO2 95%   BMI 21.93 kg/m   Visual Acuity Right Eye Distance:   Left Eye Distance:   Bilateral Distance:    Right Eye Near:   Left Eye Near:    Bilateral Near:     Physical Exam Vitals and nursing note  reviewed.  Constitutional:      Appearance: Normal appearance.  HENT:     Head: Normocephalic and atraumatic.     Right Ear: External ear normal.     Left Ear: External ear normal.     Nose: Nose normal.     Mouth/Throat:     Mouth: Mucous membranes are moist.  Eyes:     Conjunctiva/sclera: Conjunctivae normal.  Cardiovascular:     Rate and Rhythm: Normal rate.  Pulmonary:     Effort: Pulmonary effort is normal. No respiratory distress.  Skin:    General: Skin is warm and dry.  Neurological:     General: No focal deficit present.     Mental Status: She is alert and oriented to person, place, and time.  Psychiatric:        Mood and Affect: Mood normal.        Behavior: Behavior normal.      UC Treatments / Results  Labs (all labs ordered are listed, but only abnormal results are displayed) Labs Reviewed  POCT URINE DIPSTICK - Abnormal; Notable for the following components:      Result Value   Clarity, UA cloudy (*)    Ketones, POC UA small (15) (*)    All other components within normal limits  MISC [facility] TEST (SEND OUT)  POCT URINE PREGNANCY  CERVICOVAGINAL ANCILLARY ONLY    EKG   Radiology No results found.  Procedures Procedures (including critical care time)  Medications Ordered in UC Medications  cefTRIAXone  (ROCEPHIN ) injection 500 mg (has no administration in time range)    Initial Impression / Assessment and Plan / UC Course  I have reviewed the triage vital signs and the nursing notes.  Pertinent labs & imaging results that were available during my care of the patient were reviewed by me and considered in my medical decision making (see chart for details).  Vitals and triage reviewed, patient is hemodynamically stable.  Exposure to gonorrhea and chlamydia, will treat empirically in clinic.  Cytology obtained.  Patient also tested for  mycoplasma per her request.  Some dysuria.  UA unremarkable.  Urine pregnancy negative.  Staff will contact  if treatment is needed based on results.  Plan of care, follow-up care return precautions given, no questions at this time.     Final Clinical Impressions(s) / UC Diagnoses   Final diagnoses:  Exposure to sexually transmitted disease (STD)  Screen for STD (sexually transmitted disease)  Urine pregnancy test negative     Discharge Instructions      Today you have been screened for gonorrhea, chlamydia and trichomonas as well as mycoplasma.  We have empirically treated you for gonorrhea with the Rocephin  injection and for chlamydia with the doxycycline  antibiotic sent to your pharmacy.  Take these antibiotics twice daily with food to help prevent stomach upset.  Staff will contact if treatment modification is needed based on results.  Abstain from intercourse until all results have been received.  Return to clinic for any new or urgent symptoms.      ED Prescriptions     Medication Sig Dispense Auth. Provider   doxycycline  (VIBRA -TABS) 100 MG tablet Take 1 tablet (100 mg total) by mouth 2 (two) times daily for 7 days. 14 tablet Dreama, Annaelle Kasel  N, FNP      PDMP not reviewed this encounter.   Dreama Prateek Knipple  N, FNP 06/18/24 1913

## 2024-06-18 NOTE — Discharge Instructions (Signed)
 Today you have been screened for gonorrhea, chlamydia and trichomonas as well as mycoplasma.  We have empirically treated you for gonorrhea with the Rocephin  injection and for chlamydia with the doxycycline  antibiotic sent to your pharmacy.  Take these antibiotics twice daily with food to help prevent stomach upset.  Staff will contact if treatment modification is needed based on results.  Abstain from intercourse until all results have been received.  Return to clinic for any new or urgent symptoms.

## 2024-06-18 NOTE — ED Triage Notes (Signed)
 Patient was tested for STDs 2 months ago and was negative. States she got a call yesterday and a partner informed her they had a penile discharge and burning. Last slept with this person Friday. The person tested positive for chlamydia and Gonorrhea.   Patient having slight vaginal odor and abdominal cramping onset Saturday. Patient's cycle also started this past Saturday but states the sensation at the vaginal opening is irritated.

## 2024-06-20 ENCOUNTER — Ambulatory Visit (HOSPITAL_COMMUNITY): Payer: Self-pay

## 2024-06-20 LAB — CERVICOVAGINAL ANCILLARY ONLY
Bacterial Vaginitis (gardnerella): NEGATIVE
Candida Glabrata: NEGATIVE
Candida Vaginitis: NEGATIVE
Chlamydia: NEGATIVE
Comment: NEGATIVE
Comment: NEGATIVE
Comment: NEGATIVE
Comment: NEGATIVE
Comment: NEGATIVE
Comment: NORMAL
Neisseria Gonorrhea: POSITIVE — AB
Trichomonas: NEGATIVE

## 2024-06-22 LAB — MISC LABCORP TEST (SEND OUT): Labcorp test code: 180076

## 2024-08-02 ENCOUNTER — Ambulatory Visit
Admission: EM | Admit: 2024-08-02 | Discharge: 2024-08-02 | Disposition: A | Discharge status: Home / Self Care | Attending: Physician Assistant | Admitting: Physician Assistant

## 2024-08-02 ENCOUNTER — Ambulatory Visit: Admitting: Radiology

## 2024-08-02 ENCOUNTER — Other Ambulatory Visit: Payer: Self-pay

## 2024-08-02 DIAGNOSIS — M542 Cervicalgia: Secondary | ICD-10-CM

## 2024-08-02 DIAGNOSIS — M549 Dorsalgia, unspecified: Secondary | ICD-10-CM

## 2024-08-02 MED ORDER — CYCLOBENZAPRINE HCL 10 MG PO TABS: 10.0000 mg | ORAL_TABLET | Freq: Two times a day (BID) | ORAL | 0 refills | Status: DC | PRN
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
## 2024-08-02 NOTE — Discharge Instructions (Addendum)
 Based on your symptoms and physical exam I believe the following is the cause of your concern today Back pain likely secondary to a strain of your back muscles  I recommend the following at this time to help relieve that discomfort: Your x-rays do not show obvious signs of fracture or dislocation.  Radiology has reviewed the x-rays of your thoracic and lumbar spine and did not find any abnormalities there.  We are still waiting on the results for your neck imaging but I do not see any obvious evidence of bony injury.  We will keep you updated with those results once they are available Rest Warm compresses to the area (20 minutes on, minimum of 30 minutes off) You can alternate Tylenol  and Ibuprofen  for pain management but Ibuprofen  is typically preferred to reduce inflammation.  I am sending in a script for a muscle relaxer called Flexeril  for you to take up to every 8 hours as needed for muscle spasms and discomfort.  Please be advised that this can cause drowsiness and sedation so do not take it if you need to remain alert or drive.  Gentle stretches and exercises that I have included in your paperwork Try to reduce excess strain to the area and rest as much as possible  Wear supportive shoes and, if you must lift anything, use proper lifting techniques that spare your back.   If these measures do not lead to improvement in your symptoms over the next 2-4 weeks please follow-up with orthopedics or your primary care provider.
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
## 2024-08-03 ENCOUNTER — Telehealth: Payer: Self-pay | Admitting: Emergency Medicine

## 2024-08-03 NOTE — Telephone Encounter (Signed)
 Pt returned called she is going to follow up tomorrow 08/03/24 about scheduling to have x-ray redone next week.
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
## 2024-08-03 NOTE — Telephone Encounter (Signed)
 Attempted 2nd call to patient about need to redo cervical x-ray. No answer and unable to leave voicemail.  Per UC provider low need to have x-ray redone.

## 2024-08-11 ENCOUNTER — Encounter (HOSPITAL_COMMUNITY): Payer: Self-pay

## 2024-08-11 ENCOUNTER — Ambulatory Visit: Payer: Self-pay | Admitting: Physician Assistant

## 2024-08-11 ENCOUNTER — Ambulatory Visit (HOSPITAL_COMMUNITY)
Admission: RE | Admit: 2024-08-11 | Discharge: 2024-08-11 | Disposition: A | Discharge status: Home / Self Care | Source: Ambulatory Visit | Attending: Family Medicine | Admitting: Family Medicine

## 2024-08-11 VITALS — BP 147/107 | HR 74 | Temp 98.2°F | Resp 14

## 2024-08-11 DIAGNOSIS — M62830 Muscle spasm of back: Secondary | ICD-10-CM

## 2024-08-11 DIAGNOSIS — S161XXA Strain of muscle, fascia and tendon at neck level, initial encounter: Secondary | ICD-10-CM

## 2024-08-11 DIAGNOSIS — S39012A Strain of muscle, fascia and tendon of lower back, initial encounter: Secondary | ICD-10-CM

## 2024-08-11 MED ORDER — METHYLPREDNISOLONE 4 MG PO TBPK: ORAL_TABLET | ORAL | 0 refills | Status: AC

## 2024-08-11 MED ORDER — DIAZEPAM 5 MG PO TABS: 5.0000 mg | ORAL_TABLET | Freq: Two times a day (BID) | ORAL | 0 refills | Status: AC | PRN
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
